# Patient Record
Sex: Male | Born: 1935
Health system: Southern US, Community
[De-identification: ages and names within clinical notes are randomized; demographics above are authoritative.]

## PROBLEM LIST (undated history)

## (undated) DIAGNOSIS — J449 Chronic obstructive pulmonary disease, unspecified: Secondary | ICD-10-CM

## (undated) DIAGNOSIS — Z860101 Personal history of adenomatous and serrated colon polyps: Secondary | ICD-10-CM

## (undated) DIAGNOSIS — N210 Calculus in bladder: Secondary | ICD-10-CM

## (undated) DIAGNOSIS — R0602 Shortness of breath: Secondary | ICD-10-CM

## (undated) DIAGNOSIS — Z86010 Personal history of colonic polyps: Secondary | ICD-10-CM

## (undated) DIAGNOSIS — K9049 Malabsorption due to intolerance, not elsewhere classified: Secondary | ICD-10-CM

## (undated) DIAGNOSIS — E785 Hyperlipidemia, unspecified: Secondary | ICD-10-CM

## (undated) DIAGNOSIS — N400 Enlarged prostate without lower urinary tract symptoms: Secondary | ICD-10-CM

## (undated) DIAGNOSIS — K08109 Complete loss of teeth, unspecified cause, unspecified class: Secondary | ICD-10-CM

## (undated) DIAGNOSIS — R3129 Other microscopic hematuria: Secondary | ICD-10-CM

## (undated) DIAGNOSIS — K4090 Unilateral inguinal hernia, without obstruction or gangrene, not specified as recurrent: Secondary | ICD-10-CM

## (undated) DIAGNOSIS — J4489 Other specified chronic obstructive pulmonary disease: Secondary | ICD-10-CM

## (undated) DIAGNOSIS — K589 Irritable bowel syndrome without diarrhea: Secondary | ICD-10-CM

## (undated) DIAGNOSIS — M1990 Unspecified osteoarthritis, unspecified site: Secondary | ICD-10-CM

## (undated) DIAGNOSIS — Z8673 Personal history of transient ischemic attack (TIA), and cerebral infarction without residual deficits: Secondary | ICD-10-CM

## (undated) DIAGNOSIS — Z973 Presence of spectacles and contact lenses: Secondary | ICD-10-CM

## (undated) DIAGNOSIS — I10 Essential (primary) hypertension: Secondary | ICD-10-CM

## (undated) DIAGNOSIS — Z972 Presence of dental prosthetic device (complete) (partial): Secondary | ICD-10-CM

## (undated) DIAGNOSIS — R3915 Urgency of urination: Secondary | ICD-10-CM

## (undated) DIAGNOSIS — Z974 Presence of external hearing-aid: Secondary | ICD-10-CM

## (undated) DIAGNOSIS — K5730 Diverticulosis of large intestine without perforation or abscess without bleeding: Secondary | ICD-10-CM

## (undated) DIAGNOSIS — D494 Neoplasm of unspecified behavior of bladder: Secondary | ICD-10-CM

## (undated) HISTORY — PX: TONSILLECTOMY: SUR1361

---

## 1959-01-31 HISTORY — PX: INGUINAL HERNIA REPAIR: SUR1180

## 1998-01-30 HISTORY — PX: CARDIAC CATHETERIZATION: SHX172

## 1999-01-28 ENCOUNTER — Inpatient Hospital Stay (HOSPITAL_COMMUNITY): Admission: EM | Admit: 1999-01-28 | Discharge: 1999-01-29 | Payer: Self-pay | Admitting: Emergency Medicine

## 1999-01-28 ENCOUNTER — Encounter: Payer: Self-pay | Admitting: Rheumatology

## 1999-02-17 ENCOUNTER — Ambulatory Visit (HOSPITAL_COMMUNITY): Admission: RE | Admit: 1999-02-17 | Discharge: 1999-02-17 | Payer: Self-pay | Admitting: Cardiology

## 2000-08-30 ENCOUNTER — Encounter (INDEPENDENT_AMBULATORY_CARE_PROVIDER_SITE_OTHER): Payer: Self-pay | Admitting: *Deleted

## 2000-08-30 ENCOUNTER — Ambulatory Visit (HOSPITAL_COMMUNITY): Admission: RE | Admit: 2000-08-30 | Discharge: 2000-08-30 | Payer: Self-pay | Admitting: Gastroenterology

## 2002-02-05 ENCOUNTER — Inpatient Hospital Stay (HOSPITAL_COMMUNITY): Admission: AD | Admit: 2002-02-05 | Discharge: 2002-02-06 | Payer: Self-pay | Admitting: Geriatric Medicine

## 2002-02-06 ENCOUNTER — Encounter: Payer: Self-pay | Admitting: Interventional Cardiology

## 2002-02-06 HISTORY — PX: CARDIOVASCULAR STRESS TEST: SHX262

## 2003-12-08 ENCOUNTER — Encounter: Admission: RE | Admit: 2003-12-08 | Discharge: 2003-12-08 | Payer: Self-pay | Admitting: Rheumatology

## 2006-01-03 ENCOUNTER — Ambulatory Visit (HOSPITAL_BASED_OUTPATIENT_CLINIC_OR_DEPARTMENT_OTHER): Admission: RE | Admit: 2006-01-03 | Discharge: 2006-01-03 | Payer: Self-pay | Admitting: Urology

## 2006-01-03 HISTORY — PX: HYDROCELE EXCISION: SHX482

## 2006-02-21 ENCOUNTER — Ambulatory Visit: Payer: Self-pay | Admitting: Critical Care Medicine

## 2006-03-26 ENCOUNTER — Ambulatory Visit: Payer: Self-pay | Admitting: Critical Care Medicine

## 2006-04-17 ENCOUNTER — Ambulatory Visit: Payer: Self-pay | Admitting: Critical Care Medicine

## 2006-07-05 ENCOUNTER — Ambulatory Visit: Payer: Self-pay | Admitting: *Deleted

## 2006-12-18 ENCOUNTER — Ambulatory Visit: Payer: Self-pay | Admitting: Critical Care Medicine

## 2006-12-18 DIAGNOSIS — I10 Essential (primary) hypertension: Secondary | ICD-10-CM

## 2006-12-18 DIAGNOSIS — E785 Hyperlipidemia, unspecified: Secondary | ICD-10-CM | POA: Insufficient documentation

## 2006-12-18 DIAGNOSIS — J449 Chronic obstructive pulmonary disease, unspecified: Secondary | ICD-10-CM

## 2006-12-18 DIAGNOSIS — J4489 Other specified chronic obstructive pulmonary disease: Secondary | ICD-10-CM | POA: Insufficient documentation

## 2007-02-07 ENCOUNTER — Encounter: Payer: Self-pay | Admitting: Critical Care Medicine

## 2007-08-08 ENCOUNTER — Encounter: Payer: Self-pay | Admitting: Critical Care Medicine

## 2007-09-25 ENCOUNTER — Ambulatory Visit: Payer: Self-pay | Admitting: Critical Care Medicine

## 2007-09-27 ENCOUNTER — Encounter: Payer: Self-pay | Admitting: Critical Care Medicine

## 2007-09-27 DIAGNOSIS — F17200 Nicotine dependence, unspecified, uncomplicated: Secondary | ICD-10-CM | POA: Insufficient documentation

## 2008-02-14 ENCOUNTER — Encounter: Admission: RE | Admit: 2008-02-14 | Discharge: 2008-02-14 | Payer: Self-pay | Admitting: Rheumatology

## 2008-06-30 ENCOUNTER — Ambulatory Visit: Payer: Self-pay | Admitting: Critical Care Medicine

## 2008-07-06 ENCOUNTER — Encounter: Payer: Self-pay | Admitting: Critical Care Medicine

## 2009-01-07 ENCOUNTER — Telehealth: Payer: Self-pay | Admitting: Critical Care Medicine

## 2009-01-18 ENCOUNTER — Ambulatory Visit: Payer: Self-pay | Admitting: Critical Care Medicine

## 2009-02-11 ENCOUNTER — Ambulatory Visit: Payer: Self-pay | Admitting: Orthopedic Surgery

## 2010-06-14 NOTE — Assessment & Plan Note (Signed)
[facility]                             PULMONARY OFFICE NOTE   NAME:Francom, Keith Giles                       MRN:          [id number]  DATE:12/18/2006                            DOB:          1933-04-17    Keith Giles is a 77 year old white male with history of chronic  obstructive lung disease, asthmatic bronchitic component.  He comes to  the office today still coughing, still dyspneic.  He could not tolerate  Advair or Asmanex because of throat irritation.  He maintains aspirin 81  mg daily, Lipitor 20 mg daily, and hydrochlorothiazide 12.5 mg daily.  He is still smoking a pack a  day of cigarettes.   PHYSICAL EXAMINATION:  VITAL SIGNS:  Temperature 98, blood pressure  108/72, pulse 68, saturation 97% on room air.  CHEST:  Showed distant breath sounds with prolonged expiratory phase.  No wheeze or rhonchi noted.  CARDIAC:  Exam showed a regular rate and rhythm without S3, normal S1  and S2.  ABDOMEN:  Soft, nontender.  EXTREMITIES:  Show no edema or clubbing.  SKIN:  Clear.  NEUROLOGIC:  Exam intact.  HEENT/NECK:  Exam showed no jugular venous distention, no  lymphadenopathy.  Oropharynx clear.  Neck supple.   IMPRESSION:  Asthmatic bronchitis, chronic obstructive lung disease with  bronchitic components.   PLAN:  1. Begin Spiriva 1 capsule daily.  2. He is to utilize Nicoderm patch 7 mg daily for smoking cessation.  3. Will see the patient back in return followup.     Charlcie Cradle Delford Field, MD, [facility]  Electronically Signed    PEW/MedQ  DD: 12/18/2006  DT: 12/18/2006  Job #: [id number]

## 2010-06-17 NOTE — Consult Note (Signed)
NAME:  Keith Giles, Keith Giles NO.:  [id number]   MEDICAL RECORD NO.:  [id number]                   PATIENT TYPE:  INP   LOCATION:  4705                                 FACILITY:  [facility]   PHYSICIAN:  Lesleigh Noe, M.D.            DATE OF BIRTH:  03-11-1933   DATE OF CONSULTATION:  02/05/2002  DATE OF DISCHARGE:                                   CONSULTATION   INDICATION FOR CONSULTATION:  Chest discomfort.   CONCLUSIONS:  1. Intermittent precordial chest tightness x 7-10 days, uncertain etiology.     Rule out coronary artery disease.  2. Recent upper respiratory infection with cough and yellow phlegm over the     past 7-10 days, now improved on antibiotic therapy.  3. Hyperlipidemia.  4. Chronic obstructive pulmonary disease secondary to tobacco use.  5. Abnormal electrocardiogram with incomplete right bundle and there appears     to be chronic ST elevation in V2.   RECOMMENDATIONS:  1. Serial enzymes including CK-MB and troponins.  2. Serial electrocardiograms.  3. Sublingual nitroglycerin if recurrent chest pain.  4. Subcu Lovenox.  5. Adenosine Cardiolite in a.m. 02/06/2002, if patient rules out.  Coronary     angiography if the patient rules in.   COMMENTS:  The patient is a 77 year old, chronic smoker, who has had an  upper respiratory infection for the past 10-14 days.  He has also had  intermittent shoulder to shoulder chest tightness.  There is no associated  shortness of breath, diaphoresis or radiation to the arms.  The cough and  upper respiratory congestion have improved on antibiotics and phlegm has  gone from yellow to white.  He still occasionally has the chest tightness.  He has also been having a more pleuritic type right posterior mid chest  discomfort when he takes a deep breath.  No chills or fever.  This chest  discomfort that he has had is different than the episodes of discomfort that  he has had in the past that has led  to cardiac evaluation.  In January 2001,  he underwent coronary angiography which revealed luminal irregularities in  the LAD, circumflex, and RCA, but no high-grade obstruction.  He had an  indeterminate adenosine Cardiolite in 1996.   He smokes around one pack of cigarettes per day.   PAST MEDICAL HISTORY:  1. Hyperlipidemia.  2. Transient ischemic attack.  3. Cigarette smoking with chronic obstructive pulmonary disease .   FAMILY HISTORY:  Father died of myocardial infarction at age 25.   REVIEW OF SYMPTOMS:  Denies palpitations.  No orthopnea.  Has not had  syncope.  No history of hypertension.   PHYSICAL EXAMINATION:  GENERAL:  The patient is alert and oriented x 3.  VITAL SIGNS:  Blood pressure 118/70, heart rate 70.  SKIN:  Clear, no cyanosis.  NECK:  Neck veins are not distended, no carotid bruits.  LUNGS:  Clear to auscultation and percussion.  CARDIAC:  No click, no gallop.  A soft 1/6 systolic murmur is heard.  ABDOMEN:  Soft, liver and spleen not palpable, bowel sounds are normal.  EXTREMITIES:  Pulses are 2+ and symmetric in the upper and lower  extremities.   LABORATORY DATA:  EKG reveals an incomplete right bundle branch block with  ST elevation in V1 and V2.  In a comparison with prior EKGs this ST  elevation is not significantly different.  Laboratory data is pending.                                               Lesleigh Noe, M.D.    HWS/MEDQ  D:  02/05/2002  T:  02/05/2002  Job:  [id number]   cc:   Demetria Pore. Coral Spikes, M.D.  [address]  [address]  [place]  [place] [address]  Fax: [phone]   Francisca December, M.D.  [address]  [address]  [place]  [place] [address]  Fax: [phone]

## 2010-06-17 NOTE — Assessment & Plan Note (Signed)
[facility]                             PULMONARY OFFICE NOTE   NAME:Blick, ANHAD SHEELEY                       MRN:          [id number]  DATE:02/21/2006                            DOB:          [birth year]    CHIEF COMPLAINT:  Cough and shortness of breath.   HISTORY OF PRESENT ILLNESS:  This 77 year old white male has had cough  and dyspnea for two years.  The cough is productive of thick, clear  mucus.  He has had increased sneezing after he coughs.  The cough is  worse in the morning.  Also occasionally some during the day.  He has  had observed apneas when he has been asleep.  Also, wheezes when he is  asleep.  He has not yet had a formal sleep study.  He works part-time in  a golf course setting but does note dyspnea when walking up an incline.  He smoked less than a pack a day and has done so for 60 years.  He  continues to smoke currently.  He has some occasional heartburn,  indigestion.  No real chest pain.  Just feels swollen and tight.  No  hemoptysis.  No irregular heartbeats.  Denies any loss of appetite,  weight change, abdominal pain, difficulty swallowing, or sore throat.  Has noted some nasal congestion, some itching.  There has been some  joint stiffness and pain.  He is referred for further evaluation.   PAST MEDICAL HISTORY:  History of hypertension.  No history of angina,  heart failure, or heart dysrhythmias.  Does have a history of emphysema,  increased cholesterol, allergies.  No history of renal disease or sleep  apnea.  No disorders of the central nervous system.   OPERATIVE HISTORY:  Two hernia repairs, in 1961 and in 1964.  A  hydrocele repair in December, 2007.   He has adverse reactions to ANTIHISTAMINE.   CURRENT MEDICATIONS:  1. Aspirin 81 mg daily.  2. Lipitor 20 mg daily.  3. HCTZ 12.5 mg daily.   SOCIAL HISTORY:  Smoked for six years, less than a pack a day.  He lives  with a spouse, retired.   FAMILY HISTORY:   Positive for asthma in a mother.  Allergies in the  patient.  Heart disease in his father.   REVIEW OF SYSTEMS:  Noncontributory.   PHYSICAL EXAMINATION:  GENERAL:  This is an elderly male in no distress.  VITAL SIGNS:  Temp 97, blood pressure 110/74, pulse 60.  Saturation 98%  on room air.  CHEST:  Distant breath sounds with prolonged expiratory phase.  No  wheeze, rales, or rhonchi were noted.  CARDIAC:  Regular rate and rhythm without S3.  Normal S1 and S2.  ABDOMEN:  Soft, nontender.  EXTREMITIES:  No edema, clubbing, or venous disease.  SKIN:  Clear.  NEUROLOGIC:  Intact.   Chest x-ray obtained showed COPD changes with peribronchial thickening.   Spirometry obtained showed an FEV1 of 2.74, FVC 4.38, FEV1/FVC ratio of  62% predicted, compatible with mild-to-moderate obstructive defect.  Other lab data courtesy of Dr. Coral Spikes  have been sent and are reviewed  from December, 2007, a CMET and CBC are unremarkable.   IMPRESSION:  Asthmatic bronchitis with chronic obstructive lung disease,  possibly early emphysema as well with ongoing smoking use and reflux  disease.   RECOMMENDATIONS:  Patient is to initiate Protonix at 40 mg daily, to be  taken a half hour before eating at breakfast, and then the patient is to  consume a meal.  Patient is to begin Advair 250/50 1 spray b.i.d. and  pulse prednisone at 40 mg a day, tapering down by 10 mg every 2 days  until off.  Patient will also initiate Nicotrol inhaler and was given  instructions as to its proper usage.  We will see the patient back in  followup in a month's time.     Charlcie Cradle Delford Field, MD, [facility]  Electronically Signed    PEW/MedQ  DD: 02/22/2006  DT: 02/22/2006  Job #: [id number]   cc:   Demetria Pore. Coral Spikes, M.D.
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
## 2010-06-17 NOTE — Consult Note (Signed)
[facility]  Patient:    Keith Giles                          MRN: [id number] Proc. Date: 01/28/99 Adm. Date:  [id number] Attending:  Genene Churn CC:         Keith Giles, M.D., Medicine                          Consultation Report  REASON FOR CONSULTATION:   Chest pain.  HISTORY OF PRESENT ILLNESS:  Keith Giles is a 77 year old man who two days ago developed a spontaneous onset of right upper chest discomfort, which would radiate through to the shoulder and scapula.   It was associated with some nausea, but o diaphoresis or shortness of breath.   Notably, if he had something to eat, the nausea would resolve and the chest pain would become decreased after 20-30 minutes. He had several episodes of this, each becoming more severe in nature.  There is no exertional component to it.  He had the abrupt onset while driving today and decided to present to the hospital with the discomfort.  He felt that he had bronchitis, as occurred in 1997, and he had similar pains in his chest.   He was given antibiotics at the time and the discomfort resolved.   He denies any cough or sputum production at this time.  In 1996, he underwent a myocardial profusion study with thallium, which showed  possible reversible inferior defect versus diaphragmatic attenuation.  Because f the atypical presentation and the nondiagnostic nature of the study, a cardiac catheterization was not pursued.   He had several exercise treadmill tests prior to that, which were nondiagnostic as well.  PAST MEDICAL HISTORY: 1. ______ polyps. 2. History of chest wall pain. 3. Mechanical low back pain. 4. History of prostatitis.  PAST SURGICAL HISTORY: 1. Status post prostate biopsy. 2. Left inguinal hernia. 3. Tonsillectomy.  SOCIAL HISTORY:  He is currently married to his second wife and she has developed kidney failure now on peritoneal dialysis.   He spends a  fair amount of time at  home caring for her.  He smokes a pack of cigarettes per day; rarely drinks any  alcohol.   He retired from [employer] in [place].  CURRENT MEDICATIONS:  None regularly.  FAMILY HISTORY:  Significant for heart disease before the age of 39, anemia, hay fever, leukemia, stroke, and colon cancer in a cousin.  REVIEW OF SYSTEMS:  Unremarkable, except as noted above.  PHYSICAL EXAMINATION:  VITAL SIGNS:  Blood pressure 130/75, pulse 75, respiratory rate 16, temperature  afebrile.  In general, this is a well appearing 77 year old man in no acute distress, lying supine on the gurney.  HEENT:  Unremarkable. PERRLA, anicteric sclerae, oral mucosa is pink and moist.   Neck:  Supple without thyromegaly or masses.   Carotid upstrokes are normal. No bruits.   No jugular venous distention.  Chest:  Diminished breath sounds bilaterally with no wheezes, rales, or rhonchi. There is somewhat decreased excursion bilaterally.  There is some chest wall tenderness in the left upper chest that radiates through to the scapula.  There is some reproduction of the pain with movement of the left shoulder.  Heart:  Regular rhythm, normal S1, S2 is heard, no S3, S4, murmur, click, or rub noted.  The PMI is not palpable.  Abdomen:  Soft, flat, nontender, no hepatosplenomegaly or midline pulse or masses, bowel sounds present in all quadrants.  Genitalia/Rectum:  Deferred.  Extremities:  Show full range of motion, no edema, intact distal pulses. Pulses are intact over the femoral without bruit as well.  Neurologic:  He is alert and oriented x 3. Cranial nerves II-XII are intact. Motor and sensory not tested.  Skin:  Warm, dry, and clear.  ACCESSORY CLINICAL DATA:  Admission hemogram:  PT, PTT, serum electrolytes, BUN, creatinine, glucose, calcium, liver associated enzymes, all within normal limits. Amylase is 55, lipase is 24.  Urinalysis  unremarkable.  Electrocardiogram reveals incomplete right bundle branch block, slight ST elevation of V2, tracing unchanged from that performed in 1996.  A chest x-ray shows chronic obstructive pulmonary disease and no acute disease.  Total CK initially is 93 with an MB of 1.8.  Troponin is less than 0.3.  IMPRESSION:   This is a 77 year old man with multiple risk factors for coronary  disease, one of many presentations with atypical chest pain.  While the pain is  somewhat reproducible by palpation and movement, this is not completely reassuring. I am concerned that we are missing the diagnosis of coronary artery disease and in speaking with him, Keith Giles tends to at this time minimize his symptoms; however, it was severe enough for him to present himself to the office this afternoon after not being seen for four years and asked to be evaluated.  PLAN/RECOMMENDATION: 1. Agree with your desire to admit for rule out myocardial infarction, repeat CK/MB, troponin enzyme assay x 2.  2. Agree with use of intravenous nitroglycerin and heparin, though the former has produced headache and may be discontinued if worsens.  Would also give aspirin s you written for.  3. To finally settle this question, I would recommend cardiac catheterization with coronary angiography.  I would do this within the next 24 hours; however, this s Friday before a three day weekend and he has a chronically ill wife at home to are for.  If he rules out for myocardial infarction, appropriate medication should e initiated, and we can schedule his cardiac catheterization for later in the month, provided no further chest pain occurs.   Finally, would add a non-steroidal anti-inflammatory drug to his regimen. DD:  01/28/99 TD:  01/29/99 Job: [id number]

## 2010-06-17 NOTE — Assessment & Plan Note (Signed)
[facility]                             PULMONARY OFFICE NOTE   NAME:Creedon, SIGISMUND CROSS                       MRN:          [id number]  DATE:04/17/2006                            DOB:          [birth year]    Mr. Keith Giles is a 77 year old white male with history of chronic  obstructive lung disease, asthmatic bronchitic component, here for  follow-up.  He is still smoking six cigarettes daily, down from a pack a  day.  He cannot tolerate the Advair.  He takes it twice a day.  He has  severe headaches.  He is taking it once a day at 250/50 strength.  He is  having improvement in chest congestion and cough with the Advair.   PHYSICAL EXAMINATION:  VITAL SIGNS:  Temperature 97, blood pressure  124/76, pulse 57, saturation 97% on room air.  CHEST:  Diminished breath sounds with prolonged expiratory phase, no  wheeze or rhonchi were noted.  CARDIOVASCULAR:  Regular rate and rhythm without S3, normal S1 and S2.  ABDOMEN:  Soft, nontender.  EXTREMITIES:  No edema or clubbing.  SKIN:  Clear.   IMPRESSION:  Chronic obstructive lung disease, asthmatic bronchitic  component but intolerance to Advair therapy.   PLAN:  Begin Asmanex one spray daily.  He was instructed as to its  proper use.  He will take it nightly and discontinue further Advair.  He  is to continue to pursue smoking cessation.  He does have the  prescription for Nicotrol inhaler.  He was encouraged to fill this if he  cannot stop smoking completely on his own.  Will see the patient back in  follow-up in three months.     Charlcie Cradle Delford Field, MD, [facility]  Electronically Signed    PEW/MedQ  DD: 04/17/2006  DT: 04/18/2006  Job #: [id number]   cc:   Demetria Pore. Coral Spikes, M.D.

## 2010-06-17 NOTE — Procedures (Signed)
[facility]  Patient:    Keith Giles, Keith Giles                         MRN: [id number] Proc. Date: 08/30/00 Attending:  Verlin Grills, M.D.                           Procedure Report  DATE OF BIRTH:  03/01/1933.  REFERRING PHYSICIAN:  Demetria Pore. Coral Spikes, M.D.  PROCEDURE PERFORMED:  Colonoscopy.  ENDOSCOPIST:  Verlin Grills, M.D.  INDICATIONS FOR PROCEDURE:  The patient is a 77 year old male.  [name] had an episode of painless hematochezia which has resolved.  PREMEDICATION:  Fentanyl 50 mcg, Versed 6 mg.  ENDOSCOPE:  Olympus pediatric video colonoscope.  DESCRIPTION OF PROCEDURE:  After obtaining informed consent, the patient was placed in the left lateral decubitus position.  I administered intravenous fentanyl and intravenous Versed to achieve conscious sedation for the procedure.  The patients blood pressure, oxygen saturation and cardiac rhythm were monitored throughout the procedure and documented in the medical record.  Anal inspection was normal.  Digital rectal examination revealed an enlarged, non-nodular prostate.  The Olympus pediatric video colonoscope was then introduced into the rectum and advanced to the cecum.  Colonic preparation for the exam today was satisfactory.  Rectum:  Normal.  Sigmoid colon and descending colon:  Extensive left colonic diverticulosis.  Splenic flexure:  Normal.  Transverse colon:  Normal.  Hepatic flexure:  Normal.  Ascending colon:  From the midascending colon two 1 mm sessile polyps were removed with cold biopsy forceps and submitted for pathologic interpretation.  Cecum and ileocecal valve:  Normal.  ASSESSMENT: 1. Extensive left colonic diverticulosis. 2. From the ascending colon, two 1 mm sessile polyps were removed with cold    biopsy forceps. RECOMMENDATIONS:  If the ascending colon polyps return neoplastic pathologically, [name] should undergo a repeat  colonoscopy in approximately five years.  If the ascending colon polyps are non-neoplastic, [name] should undergo a repeat colonoscopy in approximately 10 years. DD:  08/30/00 TD:  08/30/00 Job: [id number]

## 2010-06-17 NOTE — Discharge Summary (Signed)
NAME:  Keith Giles, Keith Giles NO.:  [id number]   MEDICAL RECORD NO.:  [id number]                   PATIENT TYPE:  INP   LOCATION:  4705                                 FACILITY:  [facility]   PHYSICIAN:  Hal T. Stoneking, M.D.              DATE OF BIRTH:  Sep 16, 1933   DATE OF ADMISSION:  02/05/2002  DATE OF DISCHARGE:  02/06/2002                                 DISCHARGE SUMMARY   ADMISSION DIAGNOSIS:  Chest pain, rule out myocardial infarction.   DISCHARGE DIAGNOSES:  1. Noncardiac chest pain.  2. History of hyperlipidemia.  3. Chronic obstructive pulmonary disease.  4. Incomplete right bundle branch block on EKG.  5. History of transient ischemic attack.  6. Past history of cigarette smoking.   CONSULTANT:  Lyn Records, M.D., cardiology.   BRIEF HISTORY:  [name] is a 77 year old white male patient of Dr. Lennox Pippins.  He had had a recent upper respiratory infection for approximately  two weeks.  He had also had intermittent shoulder and chest tightness.  He  denied any shortness of breath or diaphoresis.  His cough seemed to have  improved somewhat on antibiotics, however, he is still having occasional  chest tightness although some of that appears to be somewhat pleuritic.  In  2/01, he had a cardiac catheterization which revealed luminal irregularities  in the LAD, circumflex and the right coronary artery, no high-grade  obstructions.   PHYSICAL EXAMINATION:  VITAL SIGNS:  Blood pressure 120/70, pulse rate 76.  SKIN:  Normal.  HEENT:  Unremarkable.  LUNGS:  Clear.  HEART:  Regular rate and rhythm, without murmurs, rubs or gallops.  ABDOMEN:  Soft, no hepatosplenomegaly or masses palpated.   LABORATORY DATA ON ADMISSION:  EKG showed the incomplete right bundle branch  block.  White count 10,900, hemoglobin 15.2, platelet count 226,000; 64%  neutrophils, 23% lymphs, 8% monos.  Pro time 11.2, PTT 31.  Sodium 138,  potassium 4.5, chloride  102, bicarb 28, glucose 125, BUN 13, creatinine 1.0.  Calcium 9.4, albumin 3.8, total protein 6.9, AST 22, ALT 27, ALP 82, total  bili 1.0.   HOSPITAL COURSE:  The patient had serial CK-MBs which were negative,  troponin 0.01.  He was seen in consultation by cardiology on the second  hospital admission day, he had a Cardiolite study which was negative for  ischemia, ejection fraction was 69%, therefore, this was felt to be  noncardiac chest pain.  The patient was then discharged home on aspirin 81  mg a day and his Pravachol 40 mg a day.  He is to follow up with Dr. Coral Spikes  in two to three weeks.  Hal T. Pete Glatter, M.D.    HTS/MEDQ  D:  03/05/2002  T:  03/05/2002  Job:  [id number]   cc:   Demetria Pore. Coral Spikes, M.D.  [address]  [address]  [place]  [place] [address]  Fax: [phone]

## 2010-06-17 NOTE — Op Note (Signed)
NAME:  Keith Giles, Keith Giles NO.:  [id number]   MEDICAL RECORD NO.:  [id number]          PATIENT TYPE:  AMB   LOCATION:  [facility]                         FACILITY:  [facility]   PHYSICIAN:  Valetta Fuller, M.D.  DATE OF BIRTH:  [birth year]   DATE OF PROCEDURE:  01/03/2006  DATE OF DISCHARGE:                               OPERATIVE REPORT   PREOPERATIVE DIAGNOSIS:  Left hydrocele.   POSTOPERATIVE DIAGNOSIS:  Left hydrocele.   PROCEDURE PERFORMED:  Left hydrocele repair.   SURGEON:  Valetta Fuller, M.D.   ANESTHESIA:  General.   INDICATIONS:  [name] is a 77 year old male.  He was diagnosed with  a large left hydrocele which was confirmed on clinical exam as well as  scrotal ultrasound.  Initially, it was not terribly bothersome to him  and we suggested continued observation.  He came in recently with  complaints that this had increased in size and become more bothersome to  him.  We talked about the pros and cons of surgical correction and he  elected to proceed with this.  He appeared to understand the advantages  and disadvantages of an approach.  Full informed consent was obtained.   TECHNIQUE AND FINDINGS:  The patient was brought to the operating room  where he had successful induction of general anesthesia.  He was prepped  and draped in the usual manner.  Again, clinical exam revealed a  moderate to significantly enlarged left hemiscrotum.  A median raphe  incision was made in the scrotum.  The underlying tunica vaginalis was  then identified.  It was quite thin walled.  This was opened and 400 mL  of clear yellow fluid was obtained.  The testis was then brought out the  incision and carefully inspected.  No other pathology was appreciated.  We reefed up the redundant sac in a Lord type of manner and also  anchored it behind the testis.  Once the hydrocele had been addressed,  we went ahead and returned the testicle to the left hemiscrotum taking  great care to  make sure the spermatic cord was in no way twisted.  A  Marcaine spermatic cord block was also performed.  4-0 Vicryl suture was  used for the Lord type of plication.  The dartos layer was closed with a  running 3-0 Vicryl suture and the scrotal  skin was closed with a running 4-0 was Vicryl suture.  A Tegaderm  dressing was applied.  The patient appeared to tolerate the procedure  well.  Blood loss was minimal and there were no obvious complications.  He is brought to the recovery room in stable condition.           ______________________________  Valetta Fuller, M.D.  Electronically Signed     DSG/MEDQ  D:  01/03/2006  T:  01/03/2006  Job:  [id number]   cc:   Demetria Pore. Coral Spikes, M.D.  Fax: [phone]

## 2010-06-17 NOTE — Assessment & Plan Note (Signed)
[facility]                             PULMONARY OFFICE NOTE   NAME:Bradly, KARTER HELLMER                       MRN:          [id number]  DATE:03/26/2006                            DOB:          04/09/1933    Mr. Netterville is a 77 year old white male with a history of chronic  obstructive lung disease with an asthmatic bronchitic component. He had  to stop his Advair because of irritation in the throat with a yeast  infection. He was given pulsed prednisone by one of my partners and has  now finished this. He is maintaining his maintenance medicines of  aspirin 81 mg daily, Lipitor 20 mg daily, HCTZ 12.5 mg daily.   PHYSICAL EXAMINATION:  VITAL SIGNS:  Temperature 97, blood pressure  198/60, pulse 65, saturation 97% on room air.  CHEST:  Showed distant breath sounds with no evidence of wheeze or  rhonchi.  CARDIAC:  Regular rate and rhythm without S3, normal S1, S2.  ABDOMEN:  Soft, nontender.  EXTREMITIES:  Showed no edema or clubbing.  HEENT:  No evidence of active yeast irritation in the throat. Nares were  clear, oropharynx was clear.   IMPRESSION:  Asthmatic bronchitis with chronic bronchitic component.   PLAN:  Plan is for the patient to continue to pursue smoking cessation  and to resume Advair at 250/50 one spray b.i.d. He was given refills on  this and reinstructed as to proper oral hygiene. He will return in 2  weeks for a recheck. If he has recurrent yeast in the upper airway, we  will have to look at another product.     Charlcie Cradle Delford Field, MD, [facility]  Electronically Signed    PEW/MedQ  DD: 03/26/2006  DT: 03/26/2006  Job #: [id number]

## 2010-08-05 ENCOUNTER — Ambulatory Visit: Payer: Self-pay | Admitting: Family Medicine

## 2011-02-08 DIAGNOSIS — M1610 Unilateral primary osteoarthritis, unspecified hip: Secondary | ICD-10-CM | POA: Diagnosis not present

## 2011-02-08 DIAGNOSIS — M25559 Pain in unspecified hip: Secondary | ICD-10-CM | POA: Diagnosis not present

## 2011-04-18 DIAGNOSIS — H2510 Age-related nuclear cataract, unspecified eye: Secondary | ICD-10-CM | POA: Diagnosis not present

## 2011-04-18 DIAGNOSIS — H1045 Other chronic allergic conjunctivitis: Secondary | ICD-10-CM | POA: Diagnosis not present

## 2011-06-09 DIAGNOSIS — N401 Enlarged prostate with lower urinary tract symptoms: Secondary | ICD-10-CM | POA: Diagnosis not present

## 2011-06-09 DIAGNOSIS — N529 Male erectile dysfunction, unspecified: Secondary | ICD-10-CM | POA: Diagnosis not present

## 2011-06-12 DIAGNOSIS — M75120 Complete rotator cuff tear or rupture of unspecified shoulder, not specified as traumatic: Secondary | ICD-10-CM | POA: Diagnosis not present

## 2011-06-13 DIAGNOSIS — D239 Other benign neoplasm of skin, unspecified: Secondary | ICD-10-CM | POA: Diagnosis not present

## 2011-06-13 DIAGNOSIS — L821 Other seborrheic keratosis: Secondary | ICD-10-CM | POA: Diagnosis not present

## 2011-06-13 DIAGNOSIS — D1800 Hemangioma unspecified site: Secondary | ICD-10-CM | POA: Diagnosis not present

## 2011-07-06 DIAGNOSIS — Z1331 Encounter for screening for depression: Secondary | ICD-10-CM | POA: Diagnosis not present

## 2011-07-06 DIAGNOSIS — R143 Flatulence: Secondary | ICD-10-CM | POA: Diagnosis not present

## 2011-07-06 DIAGNOSIS — I10 Essential (primary) hypertension: Secondary | ICD-10-CM | POA: Diagnosis not present

## 2011-07-06 DIAGNOSIS — M25519 Pain in unspecified shoulder: Secondary | ICD-10-CM | POA: Diagnosis not present

## 2011-08-08 DIAGNOSIS — M25519 Pain in unspecified shoulder: Secondary | ICD-10-CM | POA: Diagnosis not present

## 2012-01-03 DIAGNOSIS — Z0000 Encounter for general adult medical examination without abnormal findings: Secondary | ICD-10-CM | POA: Diagnosis not present

## 2012-01-03 DIAGNOSIS — R141 Gas pain: Secondary | ICD-10-CM | POA: Diagnosis not present

## 2012-01-03 DIAGNOSIS — R142 Eructation: Secondary | ICD-10-CM | POA: Diagnosis not present

## 2012-01-03 DIAGNOSIS — N529 Male erectile dysfunction, unspecified: Secondary | ICD-10-CM | POA: Diagnosis not present

## 2012-01-03 DIAGNOSIS — I10 Essential (primary) hypertension: Secondary | ICD-10-CM | POA: Diagnosis not present

## 2012-01-03 DIAGNOSIS — E785 Hyperlipidemia, unspecified: Secondary | ICD-10-CM | POA: Diagnosis not present

## 2012-01-03 DIAGNOSIS — Z1331 Encounter for screening for depression: Secondary | ICD-10-CM | POA: Diagnosis not present

## 2012-01-03 DIAGNOSIS — Z125 Encounter for screening for malignant neoplasm of prostate: Secondary | ICD-10-CM | POA: Diagnosis not present

## 2012-05-15 DIAGNOSIS — H2510 Age-related nuclear cataract, unspecified eye: Secondary | ICD-10-CM | POA: Diagnosis not present

## 2012-06-17 DIAGNOSIS — L821 Other seborrheic keratosis: Secondary | ICD-10-CM | POA: Diagnosis not present

## 2012-06-17 DIAGNOSIS — D235 Other benign neoplasm of skin of trunk: Secondary | ICD-10-CM | POA: Diagnosis not present

## 2012-06-17 DIAGNOSIS — L708 Other acne: Secondary | ICD-10-CM | POA: Diagnosis not present

## 2012-06-17 DIAGNOSIS — L723 Sebaceous cyst: Secondary | ICD-10-CM | POA: Diagnosis not present

## 2012-06-17 DIAGNOSIS — D1800 Hemangioma unspecified site: Secondary | ICD-10-CM | POA: Diagnosis not present

## 2012-06-17 DIAGNOSIS — D239 Other benign neoplasm of skin, unspecified: Secondary | ICD-10-CM | POA: Diagnosis not present

## 2012-06-17 DIAGNOSIS — L219 Seborrheic dermatitis, unspecified: Secondary | ICD-10-CM | POA: Diagnosis not present

## 2012-06-17 DIAGNOSIS — L659 Nonscarring hair loss, unspecified: Secondary | ICD-10-CM | POA: Diagnosis not present

## 2012-07-02 DIAGNOSIS — N529 Male erectile dysfunction, unspecified: Secondary | ICD-10-CM | POA: Diagnosis not present

## 2012-07-02 DIAGNOSIS — N200 Calculus of kidney: Secondary | ICD-10-CM | POA: Diagnosis not present

## 2012-07-02 DIAGNOSIS — N401 Enlarged prostate with lower urinary tract symptoms: Secondary | ICD-10-CM | POA: Diagnosis not present

## 2012-07-02 DIAGNOSIS — R319 Hematuria, unspecified: Secondary | ICD-10-CM | POA: Diagnosis not present

## 2012-07-04 DIAGNOSIS — I10 Essential (primary) hypertension: Secondary | ICD-10-CM | POA: Diagnosis not present

## 2012-10-02 DIAGNOSIS — I10 Essential (primary) hypertension: Secondary | ICD-10-CM | POA: Diagnosis not present

## 2012-10-02 DIAGNOSIS — R42 Dizziness and giddiness: Secondary | ICD-10-CM | POA: Diagnosis not present

## 2012-10-24 DIAGNOSIS — R141 Gas pain: Secondary | ICD-10-CM | POA: Diagnosis not present

## 2012-10-24 DIAGNOSIS — I10 Essential (primary) hypertension: Secondary | ICD-10-CM | POA: Diagnosis not present

## 2012-10-24 DIAGNOSIS — N529 Male erectile dysfunction, unspecified: Secondary | ICD-10-CM | POA: Diagnosis not present

## 2012-12-30 ENCOUNTER — Other Ambulatory Visit: Payer: Self-pay | Admitting: Internal Medicine

## 2012-12-30 ENCOUNTER — Other Ambulatory Visit: Payer: Self-pay | Admitting: Gastroenterology

## 2012-12-30 ENCOUNTER — Ambulatory Visit
Admission: RE | Admit: 2012-12-30 | Discharge: 2012-12-30 | Disposition: A | Discharge status: Home / Self Care | Payer: Medicare Other | Source: Ambulatory Visit | Attending: Internal Medicine | Admitting: Internal Medicine

## 2012-12-30 DIAGNOSIS — I10 Essential (primary) hypertension: Secondary | ICD-10-CM | POA: Diagnosis not present

## 2012-12-30 DIAGNOSIS — K921 Melena: Secondary | ICD-10-CM | POA: Diagnosis not present

## 2012-12-30 DIAGNOSIS — R141 Gas pain: Secondary | ICD-10-CM | POA: Diagnosis not present

## 2012-12-30 DIAGNOSIS — R05 Cough: Secondary | ICD-10-CM

## 2012-12-30 DIAGNOSIS — Z86010 Personal history of colonic polyps: Secondary | ICD-10-CM | POA: Diagnosis not present

## 2012-12-30 DIAGNOSIS — J449 Chronic obstructive pulmonary disease, unspecified: Secondary | ICD-10-CM | POA: Diagnosis not present

## 2012-12-30 DIAGNOSIS — R42 Dizziness and giddiness: Secondary | ICD-10-CM | POA: Diagnosis not present

## 2012-12-30 DIAGNOSIS — R197 Diarrhea, unspecified: Secondary | ICD-10-CM | POA: Diagnosis not present

## 2013-02-11 ENCOUNTER — Ambulatory Visit (HOSPITAL_COMMUNITY)
Admission: RE | Admit: 2013-02-11 | Discharge: 2013-02-11 | Disposition: A | Discharge status: Home / Self Care | Payer: Medicare Other | Source: Ambulatory Visit | Attending: Gastroenterology | Admitting: Gastroenterology

## 2013-02-11 ENCOUNTER — Encounter (HOSPITAL_COMMUNITY)
Admission: RE | Disposition: A | Discharge status: Home / Self Care | Payer: Self-pay | Source: Ambulatory Visit | Attending: Gastroenterology

## 2013-02-11 ENCOUNTER — Encounter (HOSPITAL_COMMUNITY): Payer: Self-pay | Admitting: *Deleted

## 2013-02-11 DIAGNOSIS — R197 Diarrhea, unspecified: Secondary | ICD-10-CM | POA: Insufficient documentation

## 2013-02-11 DIAGNOSIS — J449 Chronic obstructive pulmonary disease, unspecified: Secondary | ICD-10-CM | POA: Diagnosis not present

## 2013-02-11 DIAGNOSIS — K5730 Diverticulosis of large intestine without perforation or abscess without bleeding: Secondary | ICD-10-CM | POA: Diagnosis not present

## 2013-02-11 DIAGNOSIS — K921 Melena: Secondary | ICD-10-CM | POA: Diagnosis not present

## 2013-02-11 DIAGNOSIS — Z860100 Personal history of colon polyps, unspecified: Secondary | ICD-10-CM | POA: Insufficient documentation

## 2013-02-11 DIAGNOSIS — J4489 Other specified chronic obstructive pulmonary disease: Secondary | ICD-10-CM | POA: Insufficient documentation

## 2013-02-11 DIAGNOSIS — I10 Essential (primary) hypertension: Secondary | ICD-10-CM | POA: Insufficient documentation

## 2013-02-11 DIAGNOSIS — E7800 Pure hypercholesterolemia, unspecified: Secondary | ICD-10-CM | POA: Insufficient documentation

## 2013-02-11 DIAGNOSIS — K599 Functional intestinal disorder, unspecified: Secondary | ICD-10-CM | POA: Diagnosis not present

## 2013-02-11 HISTORY — DX: Other microscopic hematuria: R31.29

## 2013-02-11 HISTORY — DX: Essential (primary) hypertension: I10

## 2013-02-11 HISTORY — DX: Benign prostatic hyperplasia without lower urinary tract symptoms: N40.0

## 2013-02-11 HISTORY — PX: COLONOSCOPY: SHX5424

## 2013-02-11 HISTORY — DX: Chronic obstructive pulmonary disease, unspecified: J44.9

## 2013-02-11 HISTORY — DX: Unspecified osteoarthritis, unspecified site: M19.90

## 2013-02-11 HISTORY — DX: Shortness of breath: R06.02

## 2013-02-11 SURGERY — COLONOSCOPY
Anesthesia: Moderate Sedation

## 2013-02-11 MED ORDER — FENTANYL CITRATE 0.05 MG/ML IJ SOLN
INTRAMUSCULAR | Status: DC | PRN
  Administered 2013-02-11: 25 ug via INTRAVENOUS

## 2013-02-11 MED ORDER — MIDAZOLAM HCL 5 MG/5ML IJ SOLN
INTRAMUSCULAR | Status: DC | PRN
  Administered 2013-02-11 (×2): 2 mg via INTRAVENOUS

## 2013-02-11 MED ORDER — MIDAZOLAM HCL 10 MG/2ML IJ SOLN
INTRAMUSCULAR | Status: AC
  Filled 2013-02-11: qty 2

## 2013-02-11 MED ORDER — FENTANYL CITRATE 0.05 MG/ML IJ SOLN
INTRAMUSCULAR | Status: AC
Start: 2013-02-11 — End: 2013-02-11
  Filled 2013-02-11: qty 2

## 2013-02-11 NOTE — Discharge Instructions (Signed)
Colonoscopy °Care After °These instructions give you information on caring for yourself after your procedure. Your doctor may also give you more specific instructions. Call your doctor if you have any problems or questions after your procedure. °HOME CARE °· Take it easy for the next 24 hours. °· Rest. °· Walk or use warm packs on your belly (abdomen) if you have belly cramping or gas. °· Do not drive for 24 hours. °· You may shower. °· Do not sign important papers or use machinery for 24 hours. °· Drink enough fluids to keep your pee (urine) clear or pale yellow. °· Resume your normal diet. Avoid heavy or fried foods. °· Avoid alcohol. °· Continue taking your normal medicines. °· Only take medicine as told by your doctor. Do not take aspirin. °If you had growths (polyps) removed: °· Do not take aspirin. °· Do not drink alcohol for 7 days or as told by your doctor. °· Eat a soft diet for 24 hours. °GET HELP RIGHT AWAY IF: °· You have a fever. °· You pass clumps of tissue (blood clots) or fill the toilet with blood. °· You have belly pain that gets worse and medicine does not help. °· Your belly is puffy (swollen). °· You feel sick to your stomach (nauseous) or throw up (vomit). °MAKE SURE YOU: °· Understand these instructions. °· Will watch your condition. °· Will get help right away if you are not doing well or get worse. °Document Released: 02/18/2010 Document Revised: 04/10/2011 Document Reviewed: 09/23/2012 °ExitCare® Patient Information ©2014 [employer]. ° °

## 2013-02-11 NOTE — H&P (Signed)
Problem: Chronic diarrhea  History: The patient is a 80 year old male born April 17, 1933. Patient has a personal history of adenomatous colon polyps removed colonoscopically. On 08/17/2009 he underwent a normal surveillance colonoscopy.  For over a year, the patient has experienced episodes of postprandial abdominal bloating, abdominal distention, and generalized abdominal discomfort with bowel urgency and explosive diarrhea. The patient denies nausea or vomiting. He reports no weight loss. There is no history of celiac disease, carbohydrate intolerance, or diverticulitis. Intermittently, he passes a small amount of blood with his explosive diarrheal episodes. He denies nocturnal diarrhea.  Patient is scheduled to undergo a diagnostic colonoscopy with performance of random colon biopsies to look for microscopic colitis.  Past medical history: Hypertension. Benign prostatic hypertrophy. Chronic obstructive pulmonary disease. Hypercholesterolemia. Adenomatous colon polyps removed colonoscopically. Hydrocele surgery. Kidney stone. Osteoarthritis. Left inguinal herniorrhaphy. Tonsillectomy.  Allergies: Sulfa drugs.  Exam: The patient is alert and lying comfortably on the endoscopy stretcher. Abdomen is soft nontender to palpation. Lungs are clear to auscultation. Cardiac exam reveals a regular rhythm.  Plan: Proceed with diagnostic colonoscopy to evaluate chronic diarrhea.

## 2013-02-11 NOTE — Op Note (Signed)
Problem: Chronic diarrhea with intermittent small-volume hematochezia. Personal history of adenomatous colon polyps removed colonoscopically. Normal surveillance colonoscopy on 08/17/2009  Endoscopist: Earle Gell  Premedication: Fentanyl 25 mcg. Versed 4 mg  Procedure: Diagnostic colonoscopy to the ascending colon with random colon biopsies to rule out microscopic colitis. The patient was placed in the left lateral decubitus position. Anal inspection and digital rectal exam were normal. The Pentax pediatric colonoscope was introduced into the rectum and advanced to the mid ascending colon. I was unable to intubate the cecum due to colonic loop formation and patient discomfort. Performance of the colonoscopy was technically difficult due to colonic loop formation  Rectum. Normal. Retroflex view of the distal rectum normal.  Sigmoid colon and descending colon. Extensive left colonic diverticulosis  Splenic flexure. Normal  Transverse colon. Normal  Hepatic flexure. Normal  Ascending colon. Normal  Cecum and ileocecal valve: Not examined  Biopsies: Random colon biopsies were performed from the right colon and left colon to look for microscopic colitis  Assessment: Normal diagnostic proctocolonoscopy to the mid ascending colon. The cecum was not examined. Random colon biopsies to rule out microscopic colitis pending

## 2013-02-12 ENCOUNTER — Encounter (HOSPITAL_COMMUNITY): Payer: Self-pay | Admitting: Gastroenterology

## 2013-04-14 DIAGNOSIS — R42 Dizziness and giddiness: Secondary | ICD-10-CM | POA: Diagnosis not present

## 2013-05-01 DIAGNOSIS — J40 Bronchitis, not specified as acute or chronic: Secondary | ICD-10-CM | POA: Diagnosis not present

## 2013-05-01 DIAGNOSIS — J441 Chronic obstructive pulmonary disease with (acute) exacerbation: Secondary | ICD-10-CM | POA: Diagnosis not present

## 2013-05-01 DIAGNOSIS — J309 Allergic rhinitis, unspecified: Secondary | ICD-10-CM | POA: Diagnosis not present

## 2013-06-17 DIAGNOSIS — D1800 Hemangioma unspecified site: Secondary | ICD-10-CM | POA: Diagnosis not present

## 2013-06-17 DIAGNOSIS — L219 Seborrheic dermatitis, unspecified: Secondary | ICD-10-CM | POA: Diagnosis not present

## 2013-06-17 DIAGNOSIS — D239 Other benign neoplasm of skin, unspecified: Secondary | ICD-10-CM | POA: Diagnosis not present

## 2013-06-17 DIAGNOSIS — L820 Inflamed seborrheic keratosis: Secondary | ICD-10-CM | POA: Diagnosis not present

## 2013-06-17 DIAGNOSIS — L723 Sebaceous cyst: Secondary | ICD-10-CM | POA: Diagnosis not present

## 2013-06-17 DIAGNOSIS — L819 Disorder of pigmentation, unspecified: Secondary | ICD-10-CM | POA: Diagnosis not present

## 2013-06-17 DIAGNOSIS — L821 Other seborrheic keratosis: Secondary | ICD-10-CM | POA: Diagnosis not present

## 2013-07-08 DIAGNOSIS — R05 Cough: Secondary | ICD-10-CM | POA: Diagnosis not present

## 2013-07-08 DIAGNOSIS — I10 Essential (primary) hypertension: Secondary | ICD-10-CM | POA: Diagnosis not present

## 2013-07-08 DIAGNOSIS — R059 Cough, unspecified: Secondary | ICD-10-CM | POA: Diagnosis not present

## 2013-07-08 DIAGNOSIS — Z23 Encounter for immunization: Secondary | ICD-10-CM | POA: Diagnosis not present

## 2013-07-08 DIAGNOSIS — Z1331 Encounter for screening for depression: Secondary | ICD-10-CM | POA: Diagnosis not present

## 2013-07-10 DIAGNOSIS — H2510 Age-related nuclear cataract, unspecified eye: Secondary | ICD-10-CM | POA: Diagnosis not present

## 2013-07-10 DIAGNOSIS — H1045 Other chronic allergic conjunctivitis: Secondary | ICD-10-CM | POA: Diagnosis not present

## 2013-08-07 ENCOUNTER — Other Ambulatory Visit: Payer: Self-pay | Admitting: Internal Medicine

## 2013-08-07 DIAGNOSIS — R053 Chronic cough: Secondary | ICD-10-CM

## 2013-08-07 DIAGNOSIS — R079 Chest pain, unspecified: Secondary | ICD-10-CM | POA: Diagnosis not present

## 2013-08-07 DIAGNOSIS — R05 Cough: Secondary | ICD-10-CM | POA: Diagnosis not present

## 2013-08-07 DIAGNOSIS — R059 Cough, unspecified: Secondary | ICD-10-CM | POA: Diagnosis not present

## 2013-08-11 ENCOUNTER — Ambulatory Visit
Admission: RE | Admit: 2013-08-11 | Discharge: 2013-08-11 | Disposition: A | Discharge status: Home / Self Care | Payer: Medicare Other | Source: Ambulatory Visit | Attending: Internal Medicine | Admitting: Internal Medicine

## 2013-08-11 DIAGNOSIS — J438 Other emphysema: Secondary | ICD-10-CM | POA: Diagnosis not present

## 2013-08-11 DIAGNOSIS — R05 Cough: Secondary | ICD-10-CM

## 2013-08-11 DIAGNOSIS — R053 Chronic cough: Secondary | ICD-10-CM

## 2013-08-11 MED ORDER — IOHEXOL 300 MG/ML  SOLN
75.0000 mL | Freq: Once | INTRAMUSCULAR | Status: AC | PRN
  Administered 2013-08-11: 75 mL via INTRAVENOUS

## 2013-12-26 DIAGNOSIS — J329 Chronic sinusitis, unspecified: Secondary | ICD-10-CM | POA: Diagnosis not present

## 2014-01-09 DIAGNOSIS — Z1389 Encounter for screening for other disorder: Secondary | ICD-10-CM | POA: Diagnosis not present

## 2014-01-09 DIAGNOSIS — Z0000 Encounter for general adult medical examination without abnormal findings: Secondary | ICD-10-CM | POA: Diagnosis not present

## 2014-01-09 DIAGNOSIS — I10 Essential (primary) hypertension: Secondary | ICD-10-CM | POA: Diagnosis not present

## 2014-01-09 DIAGNOSIS — J449 Chronic obstructive pulmonary disease, unspecified: Secondary | ICD-10-CM | POA: Diagnosis not present

## 2014-01-09 DIAGNOSIS — N400 Enlarged prostate without lower urinary tract symptoms: Secondary | ICD-10-CM | POA: Diagnosis not present

## 2014-01-09 DIAGNOSIS — E780 Pure hypercholesterolemia: Secondary | ICD-10-CM | POA: Diagnosis not present

## 2014-01-14 DIAGNOSIS — H6121 Impacted cerumen, right ear: Secondary | ICD-10-CM | POA: Diagnosis not present

## 2014-03-17 DIAGNOSIS — J42 Unspecified chronic bronchitis: Secondary | ICD-10-CM | POA: Diagnosis not present

## 2014-03-31 DIAGNOSIS — J42 Unspecified chronic bronchitis: Secondary | ICD-10-CM | POA: Diagnosis not present

## 2014-06-23 DIAGNOSIS — L720 Epidermal cyst: Secondary | ICD-10-CM | POA: Diagnosis not present

## 2014-06-23 DIAGNOSIS — L821 Other seborrheic keratosis: Secondary | ICD-10-CM | POA: Diagnosis not present

## 2014-06-23 DIAGNOSIS — D229 Melanocytic nevi, unspecified: Secondary | ICD-10-CM | POA: Diagnosis not present

## 2014-06-23 DIAGNOSIS — I789 Disease of capillaries, unspecified: Secondary | ICD-10-CM | POA: Diagnosis not present

## 2014-06-23 DIAGNOSIS — D485 Neoplasm of uncertain behavior of skin: Secondary | ICD-10-CM | POA: Diagnosis not present

## 2014-06-23 DIAGNOSIS — D1800 Hemangioma unspecified site: Secondary | ICD-10-CM | POA: Diagnosis not present

## 2014-06-23 DIAGNOSIS — Z1283 Encounter for screening for malignant neoplasm of skin: Secondary | ICD-10-CM | POA: Diagnosis not present

## 2014-06-23 DIAGNOSIS — L820 Inflamed seborrheic keratosis: Secondary | ICD-10-CM | POA: Diagnosis not present

## 2014-06-23 DIAGNOSIS — L814 Other melanin hyperpigmentation: Secondary | ICD-10-CM | POA: Diagnosis not present

## 2014-07-13 DIAGNOSIS — K589 Irritable bowel syndrome without diarrhea: Secondary | ICD-10-CM | POA: Diagnosis not present

## 2014-07-13 DIAGNOSIS — I10 Essential (primary) hypertension: Secondary | ICD-10-CM | POA: Diagnosis not present

## 2014-07-13 DIAGNOSIS — N401 Enlarged prostate with lower urinary tract symptoms: Secondary | ICD-10-CM | POA: Diagnosis not present

## 2014-07-14 DIAGNOSIS — H2513 Age-related nuclear cataract, bilateral: Secondary | ICD-10-CM | POA: Diagnosis not present

## 2014-07-14 DIAGNOSIS — H02834 Dermatochalasis of left upper eyelid: Secondary | ICD-10-CM | POA: Diagnosis not present

## 2014-07-14 DIAGNOSIS — H04213 Epiphora due to excess lacrimation, bilateral lacrimal glands: Secondary | ICD-10-CM | POA: Diagnosis not present

## 2014-07-14 DIAGNOSIS — H02831 Dermatochalasis of right upper eyelid: Secondary | ICD-10-CM | POA: Diagnosis not present

## 2014-08-28 DIAGNOSIS — R634 Abnormal weight loss: Secondary | ICD-10-CM | POA: Diagnosis not present

## 2014-08-28 DIAGNOSIS — R140 Abdominal distension (gaseous): Secondary | ICD-10-CM | POA: Diagnosis not present

## 2014-08-28 DIAGNOSIS — R197 Diarrhea, unspecified: Secondary | ICD-10-CM | POA: Diagnosis not present

## 2014-08-28 DIAGNOSIS — R195 Other fecal abnormalities: Secondary | ICD-10-CM | POA: Diagnosis not present

## 2014-11-14 DIAGNOSIS — H6093 Unspecified otitis externa, bilateral: Secondary | ICD-10-CM | POA: Diagnosis not present

## 2014-12-07 DIAGNOSIS — Z23 Encounter for immunization: Secondary | ICD-10-CM | POA: Diagnosis not present

## 2015-01-12 DIAGNOSIS — I700 Atherosclerosis of aorta: Secondary | ICD-10-CM | POA: Diagnosis not present

## 2015-01-12 DIAGNOSIS — Z1389 Encounter for screening for other disorder: Secondary | ICD-10-CM | POA: Diagnosis not present

## 2015-01-12 DIAGNOSIS — I10 Essential (primary) hypertension: Secondary | ICD-10-CM | POA: Diagnosis not present

## 2015-01-12 DIAGNOSIS — K589 Irritable bowel syndrome without diarrhea: Secondary | ICD-10-CM | POA: Diagnosis not present

## 2015-05-17 DIAGNOSIS — C674 Malignant neoplasm of posterior wall of bladder: Secondary | ICD-10-CM | POA: Diagnosis not present

## 2015-05-17 DIAGNOSIS — N202 Calculus of kidney with calculus of ureter: Secondary | ICD-10-CM | POA: Diagnosis not present

## 2015-05-17 DIAGNOSIS — N401 Enlarged prostate with lower urinary tract symptoms: Secondary | ICD-10-CM | POA: Diagnosis not present

## 2015-05-17 DIAGNOSIS — N210 Calculus in bladder: Secondary | ICD-10-CM | POA: Diagnosis not present

## 2015-05-17 DIAGNOSIS — Z0000 Encounter for general adult medical examination without abnormal findings: Secondary | ICD-10-CM | POA: Diagnosis not present

## 2015-05-17 DIAGNOSIS — N138 Other obstructive and reflux uropathy: Secondary | ICD-10-CM | POA: Diagnosis not present

## 2015-05-21 ENCOUNTER — Other Ambulatory Visit: Payer: Self-pay | Admitting: Urology

## 2015-05-31 ENCOUNTER — Encounter (HOSPITAL_BASED_OUTPATIENT_CLINIC_OR_DEPARTMENT_OTHER): Payer: Self-pay | Admitting: *Deleted

## 2015-05-31 NOTE — Progress Notes (Signed)
NPO AFTER MN.  ARRIVE AT 0800.  NEEDS ISTAT AND EKG. 

## 2015-06-09 ENCOUNTER — Encounter (HOSPITAL_BASED_OUTPATIENT_CLINIC_OR_DEPARTMENT_OTHER): Payer: Self-pay | Admitting: Anesthesiology

## 2015-06-09 ENCOUNTER — Encounter (HOSPITAL_BASED_OUTPATIENT_CLINIC_OR_DEPARTMENT_OTHER)
Admission: RE | Disposition: A | Discharge status: Home / Self Care | Payer: Self-pay | Source: Ambulatory Visit | Attending: Urology

## 2015-06-09 ENCOUNTER — Ambulatory Visit (HOSPITAL_BASED_OUTPATIENT_CLINIC_OR_DEPARTMENT_OTHER)
Admission: RE | Admit: 2015-06-09 | Discharge: 2015-06-09 | Disposition: A | Discharge status: Home / Self Care | Payer: Medicare Other | Source: Ambulatory Visit | Attending: Urology | Admitting: Urology

## 2015-06-09 ENCOUNTER — Ambulatory Visit (HOSPITAL_BASED_OUTPATIENT_CLINIC_OR_DEPARTMENT_OTHER): Payer: Medicare Other | Admitting: Anesthesiology

## 2015-06-09 DIAGNOSIS — Z96649 Presence of unspecified artificial hip joint: Secondary | ICD-10-CM | POA: Insufficient documentation

## 2015-06-09 DIAGNOSIS — N3289 Other specified disorders of bladder: Secondary | ICD-10-CM | POA: Diagnosis not present

## 2015-06-09 DIAGNOSIS — N138 Other obstructive and reflux uropathy: Secondary | ICD-10-CM | POA: Diagnosis not present

## 2015-06-09 DIAGNOSIS — Z7982 Long term (current) use of aspirin: Secondary | ICD-10-CM | POA: Diagnosis not present

## 2015-06-09 DIAGNOSIS — I10 Essential (primary) hypertension: Secondary | ICD-10-CM | POA: Insufficient documentation

## 2015-06-09 DIAGNOSIS — E7800 Pure hypercholesterolemia, unspecified: Secondary | ICD-10-CM | POA: Insufficient documentation

## 2015-06-09 DIAGNOSIS — N202 Calculus of kidney with calculus of ureter: Secondary | ICD-10-CM | POA: Insufficient documentation

## 2015-06-09 DIAGNOSIS — C674 Malignant neoplasm of posterior wall of bladder: Secondary | ICD-10-CM | POA: Insufficient documentation

## 2015-06-09 DIAGNOSIS — Z87891 Personal history of nicotine dependence: Secondary | ICD-10-CM | POA: Insufficient documentation

## 2015-06-09 DIAGNOSIS — N210 Calculus in bladder: Secondary | ICD-10-CM | POA: Diagnosis not present

## 2015-06-09 DIAGNOSIS — N359 Urethral stricture, unspecified: Secondary | ICD-10-CM | POA: Diagnosis not present

## 2015-06-09 DIAGNOSIS — N401 Enlarged prostate with lower urinary tract symptoms: Secondary | ICD-10-CM | POA: Insufficient documentation

## 2015-06-09 DIAGNOSIS — Z79899 Other long term (current) drug therapy: Secondary | ICD-10-CM | POA: Insufficient documentation

## 2015-06-09 DIAGNOSIS — J449 Chronic obstructive pulmonary disease, unspecified: Secondary | ICD-10-CM | POA: Diagnosis not present

## 2015-06-09 DIAGNOSIS — Q549 Hypospadias, unspecified: Secondary | ICD-10-CM | POA: Insufficient documentation

## 2015-06-09 HISTORY — PX: URETEROSCOPY: SHX842

## 2015-06-09 HISTORY — DX: Other specified chronic obstructive pulmonary disease: J44.89

## 2015-06-09 HISTORY — DX: Presence of external hearing-aid: Z97.4

## 2015-06-09 HISTORY — DX: Hyperlipidemia, unspecified: E78.5

## 2015-06-09 HISTORY — PX: CYSTOSCOPY W/ RETROGRADES: SHX1426

## 2015-06-09 HISTORY — DX: Personal history of colonic polyps: Z86.010

## 2015-06-09 HISTORY — DX: Complete loss of teeth, unspecified cause, unspecified class: K08.109

## 2015-06-09 HISTORY — DX: Calculus in bladder: N21.0

## 2015-06-09 HISTORY — DX: Presence of dental prosthetic device (complete) (partial): Z97.2

## 2015-06-09 HISTORY — DX: Personal history of adenomatous and serrated colon polyps: Z86.0101

## 2015-06-09 HISTORY — DX: Personal history of transient ischemic attack (TIA), and cerebral infarction without residual deficits: Z86.73

## 2015-06-09 HISTORY — DX: Neoplasm of unspecified behavior of bladder: D49.4

## 2015-06-09 HISTORY — DX: Diverticulosis of large intestine without perforation or abscess without bleeding: K57.30

## 2015-06-09 HISTORY — DX: Chronic obstructive pulmonary disease, unspecified: J44.9

## 2015-06-09 HISTORY — PX: CYSTOSCOPY WITH URETHRAL DILATATION: SHX5125

## 2015-06-09 HISTORY — PX: TRANSURETHRAL RESECTION OF BLADDER TUMOR: SHX2575

## 2015-06-09 HISTORY — DX: Unilateral inguinal hernia, without obstruction or gangrene, not specified as recurrent: K40.90

## 2015-06-09 HISTORY — DX: Urgency of urination: R39.15

## 2015-06-09 HISTORY — DX: Irritable bowel syndrome, unspecified: K58.9

## 2015-06-09 HISTORY — DX: Malabsorption due to intolerance, not elsewhere classified: K90.49

## 2015-06-09 HISTORY — DX: Presence of spectacles and contact lenses: Z97.3

## 2015-06-09 LAB — POCT I-STAT, CHEM 8
BUN: 17 mg/dL (ref 6–20)
CALCIUM ION: 1.22 mmol/L (ref 1.13–1.30)
CREATININE: 1 mg/dL (ref 0.61–1.24)
Chloride: 101 mmol/L (ref 101–111)
GLUCOSE: 98 mg/dL (ref 65–99)
HCT: 48 % (ref 39.0–52.0)
Hemoglobin: 16.3 g/dL (ref 13.0–17.0)
Potassium: 4 mmol/L (ref 3.5–5.1)
SODIUM: 139 mmol/L (ref 135–145)
TCO2: 23 mmol/L (ref 0–100)

## 2015-06-09 SURGERY — CYSTOSCOPY, WITH URETHRAL DILATION
Anesthesia: General | Site: Urethra

## 2015-06-09 MED ORDER — PROPOFOL 10 MG/ML IV BOLUS
INTRAVENOUS | Status: AC
  Filled 2015-06-09: qty 40

## 2015-06-09 MED ORDER — PROPOFOL 10 MG/ML IV BOLUS
INTRAVENOUS | Status: DC | PRN
  Administered 2015-06-09: 150 mg via INTRAVENOUS

## 2015-06-09 MED ORDER — CEFAZOLIN SODIUM-DEXTROSE 2-4 GM/100ML-% IV SOLN
INTRAVENOUS | Status: AC
  Filled 2015-06-09: qty 100

## 2015-06-09 MED ORDER — EPHEDRINE SULFATE 50 MG/ML IJ SOLN
INTRAMUSCULAR | Status: AC
  Filled 2015-06-09: qty 3

## 2015-06-09 MED ORDER — SODIUM CHLORIDE 0.9 % IR SOLN
Status: DC | PRN
  Administered 2015-06-09: 2000 mL

## 2015-06-09 MED ORDER — FENTANYL CITRATE (PF) 100 MCG/2ML IJ SOLN
INTRAMUSCULAR | Status: DC | PRN
  Administered 2015-06-09: 25 ug via INTRAVENOUS
  Administered 2015-06-09: 50 ug via INTRAVENOUS

## 2015-06-09 MED ORDER — ONDANSETRON HCL 4 MG/2ML IJ SOLN
INTRAMUSCULAR | Status: DC | PRN
  Administered 2015-06-09: 4 mg via INTRAVENOUS

## 2015-06-09 MED ORDER — CEFAZOLIN SODIUM-DEXTROSE 2-4 GM/100ML-% IV SOLN
2.0000 g | INTRAVENOUS | Status: DC
  Filled 2015-06-09: qty 100

## 2015-06-09 MED ORDER — DEXAMETHASONE SODIUM PHOSPHATE 4 MG/ML IJ SOLN
INTRAMUSCULAR | Status: DC | PRN
  Administered 2015-06-09: 10 mg via INTRAVENOUS

## 2015-06-09 MED ORDER — DEXAMETHASONE SODIUM PHOSPHATE 10 MG/ML IJ SOLN
INTRAMUSCULAR | Status: AC
  Filled 2015-06-09: qty 1

## 2015-06-09 MED ORDER — ONDANSETRON HCL 4 MG/2ML IJ SOLN
INTRAMUSCULAR | Status: AC
  Filled 2015-06-09: qty 2

## 2015-06-09 MED ORDER — FENTANYL CITRATE (PF) 100 MCG/2ML IJ SOLN
25.0000 ug | INTRAMUSCULAR | Status: DC | PRN
Start: 2015-06-09 — End: 2015-06-09
  Filled 2015-06-09: qty 1

## 2015-06-09 MED ORDER — SODIUM CHLORIDE 0.9 % IJ SOLN
INTRAMUSCULAR | Status: AC
  Filled 2015-06-09: qty 20

## 2015-06-09 MED ORDER — LACTATED RINGERS IV SOLN
INTRAVENOUS | Status: DC
  Administered 2015-06-09: 09:00:00 via INTRAVENOUS
  Filled 2015-06-09: qty 1000

## 2015-06-09 MED ORDER — KETOROLAC TROMETHAMINE 30 MG/ML IJ SOLN
INTRAMUSCULAR | Status: AC
  Filled 2015-06-09: qty 1

## 2015-06-09 MED ORDER — STERILE WATER FOR IRRIGATION IR SOLN
Status: DC | PRN
  Administered 2015-06-09: 3000 mL

## 2015-06-09 MED ORDER — BELLADONNA ALKALOIDS-OPIUM 16.2-60 MG RE SUPP
RECTAL | Status: AC
  Filled 2015-06-09: qty 1

## 2015-06-09 MED ORDER — DIATRIZOATE MEGLUMINE 30 % UR SOLN
URETHRAL | Status: DC | PRN
  Administered 2015-06-09: 9 mL via URETHRAL

## 2015-06-09 MED ORDER — LIDOCAINE HCL 2 % EX GEL
CUTANEOUS | Status: DC | PRN
  Administered 2015-06-09: 1 via URETHRAL

## 2015-06-09 MED ORDER — HYDROCODONE-ACETAMINOPHEN 5-325 MG PO TABS: 1.0000 | ORAL_TABLET | Freq: Four times a day (QID) | ORAL | Status: DC | PRN

## 2015-06-09 MED ORDER — EPHEDRINE SULFATE 50 MG/ML IJ SOLN
INTRAMUSCULAR | Status: DC | PRN
  Administered 2015-06-09: 15 mg via INTRAVENOUS
  Administered 2015-06-09: 10 mg via INTRAVENOUS
  Administered 2015-06-09: 15 mg via INTRAVENOUS
  Administered 2015-06-09: 10 mg via INTRAVENOUS
  Administered 2015-06-09: 5 mg via INTRAVENOUS

## 2015-06-09 MED ORDER — FENTANYL CITRATE (PF) 100 MCG/2ML IJ SOLN
INTRAMUSCULAR | Status: AC
  Filled 2015-06-09: qty 2

## 2015-06-09 MED ORDER — WHITE PETROLATUM GEL
Status: AC
  Filled 2015-06-09: qty 10

## 2015-06-09 MED ORDER — LIDOCAINE HCL (CARDIAC) 20 MG/ML IV SOLN
INTRAVENOUS | Status: DC | PRN
  Administered 2015-06-09: 100 mg via INTRAVENOUS

## 2015-06-09 MED ORDER — LIDOCAINE HCL (CARDIAC) 20 MG/ML IV SOLN
INTRAVENOUS | Status: AC
  Filled 2015-06-09: qty 5

## 2015-06-09 SURGICAL SUPPLY — 71 items
ADAPTER CATH URET PLST 4-6FR (CATHETERS) IMPLANT
BAG DRAIN URO-CYSTO SKYTR STRL (DRAIN) ×6 IMPLANT
BAG URINE DRAINAGE (UROLOGICAL SUPPLIES) ×6 IMPLANT
BAG URINE LEG 19OZ MD ST LTX (BAG) IMPLANT
BAG URINE LEG 500ML (DRAIN) IMPLANT
BALLN NEPHROSTOMY (BALLOONS) ×6
BALLOON NEPHROSTOMY (BALLOONS) ×4 IMPLANT
BASKET LASER NITINOL 1.9FR (BASKET) IMPLANT
BASKET STNLS GEMINI 4WIRE 3FR (BASKET) IMPLANT
BASKET ZERO TIP NITINOL 2.4FR (BASKET) IMPLANT
BENZOIN TINCTURE PRP APPL 2/3 (GAUZE/BANDAGES/DRESSINGS) IMPLANT
CANISTER SUCT LVC 12 LTR MEDI- (MISCELLANEOUS) IMPLANT
CATH FOLEY 2WAY SLVR  5CC 14FR (CATHETERS) ×2
CATH FOLEY 2WAY SLVR  5CC 16FR (CATHETERS)
CATH FOLEY 2WAY SLVR  5CC 18FR (CATHETERS)
CATH FOLEY 2WAY SLVR  5CC 20FR (CATHETERS)
CATH FOLEY 2WAY SLVR  5CC 22FR (CATHETERS)
CATH FOLEY 2WAY SLVR 5CC 14FR (CATHETERS) ×4 IMPLANT
CATH FOLEY 2WAY SLVR 5CC 16FR (CATHETERS) IMPLANT
CATH FOLEY 2WAY SLVR 5CC 18FR (CATHETERS) IMPLANT
CATH FOLEY 2WAY SLVR 5CC 20FR (CATHETERS) IMPLANT
CATH FOLEY 2WAY SLVR 5CC 22FR (CATHETERS) IMPLANT
CATH INTERMIT  6FR 70CM (CATHETERS) ×6 IMPLANT
CATH URET 5FR 28IN CONE TIP (BALLOONS) ×2
CATH URET 5FR 28IN OPEN ENDED (CATHETERS) IMPLANT
CATH URET 5FR 70CM CONE TIP (BALLOONS) ×4 IMPLANT
CLOTH BEACON ORANGE TIMEOUT ST (SAFETY) ×12 IMPLANT
DRSG TEGADERM 2-3/8X2-3/4 SM (GAUZE/BANDAGES/DRESSINGS) IMPLANT
ELECT BUTTON BIOP 24F 90D PLAS (MISCELLANEOUS) IMPLANT
ELECT REM PT RETURN 9FT ADLT (ELECTROSURGICAL) ×6
ELECTRODE REM PT RTRN 9FT ADLT (ELECTROSURGICAL) ×4 IMPLANT
EVACUATOR MICROVAS BLADDER (UROLOGICAL SUPPLIES) IMPLANT
FIBER LASER FLEXIVA 1000 (UROLOGICAL SUPPLIES) IMPLANT
FIBER LASER FLEXIVA 365 (UROLOGICAL SUPPLIES) IMPLANT
FIBER LASER FLEXIVA 550 (UROLOGICAL SUPPLIES) IMPLANT
FIBER LASER TRAC TIP (UROLOGICAL SUPPLIES) IMPLANT
GLOVE BIO SURGEON STRL SZ7.5 (GLOVE) ×6 IMPLANT
GLOVE SURG SS PI 7.5 STRL IVOR (GLOVE) ×12 IMPLANT
GOWN STRL REUS W/ TWL XL LVL3 (GOWN DISPOSABLE) ×4 IMPLANT
GOWN STRL REUS W/TWL LRG LVL3 (GOWN DISPOSABLE) ×6 IMPLANT
GOWN STRL REUS W/TWL XL LVL3 (GOWN DISPOSABLE) ×2
GOWN XL W/COTTON TOWEL STD (GOWNS) IMPLANT
GUIDEWIRE 0.038 PTFE COATED (WIRE) IMPLANT
GUIDEWIRE ANG ZIPWIRE 038X150 (WIRE) IMPLANT
GUIDEWIRE STR DUAL SENSOR (WIRE) ×6 IMPLANT
HOLDER FOLEY CATH W/STRAP (MISCELLANEOUS) ×6 IMPLANT
IV NS 1000ML (IV SOLUTION) ×2
IV NS 1000ML BAXH (IV SOLUTION) ×4 IMPLANT
IV NS IRRIG 3000ML ARTHROMATIC (IV SOLUTION) IMPLANT
KIT BALLIN UROMAX 15FX10 (LABEL) IMPLANT
KIT BALLN UROMAX 15FX4 (MISCELLANEOUS) IMPLANT
KIT BALLN UROMAX 26 75X4 (MISCELLANEOUS)
KIT ROOM TURNOVER WOR (KITS) ×6 IMPLANT
LOOP CUT BIPOLAR 24F LRG (ELECTROSURGICAL) ×6 IMPLANT
MANIFOLD NEPTUNE II (INSTRUMENTS) ×6 IMPLANT
NDL SAFETY ECLIPSE 18X1.5 (NEEDLE) IMPLANT
NEEDLE HYPO 18GX1.5 SHARP (NEEDLE)
NEEDLE HYPO 22GX1.5 SAFETY (NEEDLE) IMPLANT
NS IRRIG 500ML POUR BTL (IV SOLUTION) IMPLANT
PACK CYSTO (CUSTOM PROCEDURE TRAY) ×6 IMPLANT
PLUG CATH AND CAP STER (CATHETERS) IMPLANT
SET ASPIRATION TUBING (TUBING) IMPLANT
SET HIGH PRES BAL DIL (LABEL)
SHEATH ACCESS URETERAL 38CM (SHEATH) IMPLANT
SYR 20CC LL (SYRINGE) IMPLANT
SYR 30ML LL (SYRINGE) IMPLANT
SYRINGE 10CC LL (SYRINGE) ×6 IMPLANT
SYRINGE IRR TOOMEY STRL 70CC (SYRINGE) ×6 IMPLANT
TUBE CONNECTING 12'X1/4 (SUCTIONS)
TUBE CONNECTING 12X1/4 (SUCTIONS) IMPLANT
WATER STERILE IRR 3000ML UROMA (IV SOLUTION) ×6 IMPLANT

## 2015-06-09 NOTE — Addendum Note (Signed)
Addendum  created 06/09/15 1305 by Wanita Chamberlain, CRNA   Modules edited: Anesthesia Flowsheet

## 2015-06-09 NOTE — H&P (Signed)
History of Present Illness     Keith Giles presents today to reestablish in our office. He was last seen here about 2-1/2 years ago. He is currently 66 years young. He does have a longstanding history of some bladder neck obstruction and BPH. He has typically had a weak stream. He has tried both Cialis as well as alpha blockers in the past without any tremendous improvement. He also has a prior history of a hydrocele repaired by me. The last time I saw him, the situation was fairly stable. We told him to continue to be seen periodically, but he has not been back for any followup. He also does have a history of nephrolithiasis, and the last time he was here there did appear to be some stones, primarily in the lower pole of the left kidney, that appeared to be unchanged. We felt that these could be observed.  He tells me in the interim, he has been involved in the [place] system. He went through a whole battery of tests and was noted to have some microhematuria. For that reason, he had additional assessment. The 2D imaging revealed some bilateral nonobstructing renal stones with the largest being in the lower pole of the left kidney. These appeared to be in the 6-8 mm size range, but the majority of the stones were smaller. There also appeared to be a questionable tiny 2 mm calcification in the area of the distal left ureter, potentially in the intramural ureter, but there was no evidence of obstruction. He had a hyperdense renal cyst that appeared benign. He also had a diffusely thickened bladder consistent with outlet obstruction.  Keith Giles also underwent cystoscopy. I have reviewed the operative report from the urologist at the [facility] system. He was felt to have a 1.5 cm tumor on the posterior wall of his bladder. The urologist recommended that he undergo a cystoscopy with TURBT of the bladder tumor along with further evaluation of the left ureter for possible ureteral stone. Keith Giles wanted to get our opinion  and ultimately decided if something was done that he would prefer to have this done in [place].     Past Medical History Problems  1. History of Arthritis 2. History of hypercholesterolemia (Z86.39) 3. History of hypertension (Z86.79)  Surgical History Problems  1. History of Hernia Repair 2. History of Surgery Spermatic Cord Excision Of Hydrocele 3. History of Tonsillectomy 4. History of Total Hip Replacement 5. History of Total Hip Replacement  Current Meds 1. Aspirin 81 MG TABS;  Therapy: (Recorded:20Mar2008) to Recorded 2. Cialis 5 MG Oral Tablet;  Therapy: (Recorded:17Apr2017) to Recorded 3. HydroCHLOROthiazide TABS;  Therapy: (Recorded:20Mar2008) to Recorded 4. Lipitor TABS;  Therapy: (Recorded:10May2013) to Recorded 5. Tamsulosin HCl - 0.4 MG Oral Capsule;  Therapy: (Recorded:17Apr2017) to Recorded 6. Viberzi 100 MG Oral Tablet;  Therapy: (Recorded:17Apr2017) to Recorded  Allergies Medication  1. Anti-Hist CAPS  Family History Problems  1. Family history of Acute Myocardial Infarction : Father 2. Family history of Asthma : Mother 3. Family history of Death In The Family Father   58 4. Family history of Death In The Family Mother   30 5. Family history of Family Health Status Number Of Children   1 biological son that is deceased, but has 8 step children 6. Family history of Heart Disease : Father 19. Family history of Lung Cancer : Son 8. Family history of Pneumonia : Mother  Social History Problems  1. Alcohol Use (History)   rarely 2. Caffeine  Use   moderate amt of tea 3. Death In The Family Father   48yrs, MI 4. Death In The Family Mother   [id number], complications from asthma 5. Former Smoker   stopped smoking 4/10 6. Occupation:   retired  Review of Systems Genitourinary, constitutional, skin, eye, otolaryngeal, hematologic/lymphatic, cardiovascular, pulmonary, endocrine, musculoskeletal, gastrointestinal, neurological and  psychiatric system(s) were reviewed and pertinent findings if present are noted and are otherwise negative.  Genitourinary: urinary frequency, nocturia, difficulty starting the urinary stream, weak urinary stream, incomplete emptying of bladder and erectile dysfunction, but no dysuria and no hematuria.  Gastrointestinal: heartburn, diarrhea and constipation.  Constitutional: feeling tired (fatigue).  Integumentary: pruritus.  ENT: sinus problems.  Respiratory: cough.  Musculoskeletal: joint pain and joint swelling, but no back pain.  Neurological: dizziness.    Vitals Vital Signs [Data Includes: Last 1 Day]  Recorded: 17Apr2017 02:40PM  Height: 5 ft 11 in Weight: 174 lb  BMI Calculated: 24.27 BSA Calculated: 1.99 Blood Pressure: 123 / 74 Temperature: 97.6 F Heart Rate: 72  Physical Exam Constitutional: Well nourished and well developed . No acute distress.   Pulmonary: No respiratory distress and normal respiratory rhythm and effort.   Cardiovascular: Heart rate and rhythm are normal . No peripheral edema.   Rectal: Estimated prostate size is 1+. Normal rectal tone, no rectal masses, prostate is smooth, symmetric and non-tender. The prostate has no nodularity and is not indurated.   Genitourinary: Examination of the penis demonstrates hypospadias and meatal stenosis, but no discharge, no masses and no lesions. The scrotum is without lesions. The right epididymis is palpably normal and non-tender. The left epididymis is palpably normal and non-tender. The right testis is non-tender and without masses. The left testis is non-tender and without masses.   Skin: Normal skin turgor, no visible rash and no visible skin lesions.   Neuro/Psych:. Mood and affect are appropriate.    Assessment Assessed  1. 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 Calculus of ureter (N20.1) 3. Bladder calculus (N21.0) 4. Benign prostatic hyperplasia with urinary obstruction  (N40.1,N13.8) 5. Nephrolithiasis (N20.0)  Plan Cancer of posterior wall of urinary bladder  1. Follow-up Schedule Surgery Office  Follow-up  Status: Complete  Done: 17Apr2017  Discussion/Summary         Keith Giles has had some longstanding microhematuria. It is certainly not surprising, given his bilateral nonobstructing renal calculi. Cystoscopy recently revealed what was felt to be a 1.5 cm tumor. Their recommendation was to proceed with transurethral resection of the tumor along with further assessment of the bladder and evaluation of the distal ureter. That certainly seems very appropriate and reasonable due to requiring _____ at the time of cystoscopy. Keith Giles is interested in having a procedure done here in [place]. Assuming that they will cover the procedure for him, we would be delighted to help him out. I would anticipate outpatient procedure. We will plan on dilation of his hypospadias meatal stenosis and then proceed with careful cystoscopy. If a tumor or abnormal area is appreciated, I would either resect it transurethrally with TURBT instrumentation or potential cold-cup resection. We will plan on further evaluation of the distal left ureter with retrograde pyelography and ureteroscopy as necessary. Bladder stones will be removed as well.      Amendment Lavone Orn, MD   Signatures Electronically signed by : Rana Snare, M.D.; May 18 2015  7:32PM EST

## 2015-06-09 NOTE — Op Note (Signed)
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  Anesthesia: Gen.  Indications: Mr. Kerwin is 82 years of age. He recently reestablished in our office. He has had some long-standing issues with bladder neck obstruction and BPH. He had recently undergone an evaluation at the [place] because of some microhematuria. CT imaging had revealed some bilateral nonobstructing renal stones. There was also a question of a 2 mm distal left ureteral stone. The patient subsequently underwent cystoscopy at the [facility] and was felt to have a 1.5 cm tumor on the posterior wall of his bladder. There is also notation of multiple bladder calculi. The patient decided to have this procedure done in [place].     Technique and findings: Patient was brought the operating room where he had successful induction general anesthesia. He was placed in lithotomy position and prepped and draped the usual manner. He received perioperative antibiotics and appropriate surgical timeout was performed. The patient had distal hypospadias with some stenosis. He underwent dilation to 11 [place] with [employer] sounds. Cystoscopy revealed a unremarkable anterior urethra. The prostatic urethra measured approximate 4 cm and there was moderate trilobar hyperplasia with a fairly prominent median bar and visual obstruction. Within the bladder there were approximately 6-8 small 2-3 mm bladder calculi noted. There was also a 2 cm tumor involving the posterior wall of the bladder. This tumor appeared papillary and likely to be noninvasive. The bladder was moderately trabeculated with some degree of cellule formation.   Left retrograde  pyelography was performed. There was evidence of J hooking of the ureter and a questionable area of obstruction up proximally a centimeter above the ureterovesical junction. With ongoing time and fluoroscopic interpretation this area never drained well. For that reason we went ahead and placed a guidewire into the left renal pelvis and went ahead with rigid ureteroscopy with the needle tip rigid ureteroscope. There was no evidence of intermural pathology. No stones were appreciated. The guidewire was removed.   Attention was then turned towards tumor resection. A 26 French resectoscope with gyrus instrumentation was utilized. Saline was used as an irrigant. The posterior wall bladder tumor was then resected. There was no evidence of bladder perforation but that area did appear thinned in between 2 cellules. Hemostasis was quite good. There were a few areas on the bladder neck that were oozing from the instrumentation which were then fulgurated. I elected to place an 27 French Foley catheter because of some bladder wall thickening. Patient was brought to recovery room in stable condition. No obvious complications occurred.

## 2015-06-09 NOTE — Interval H&P Note (Signed)
History and Physical Interval Note:  06/09/2015 9:37 AM  Keith Giles  has presented today for surgery, with the diagnosis of BLADDER TUMOR, BLADDER CALCULUS, POSSIBLE URETERAL CALCULUS  The various methods of treatment have been discussed with the patient and family. After consideration of risks, benefits and other options for treatment, the patient has consented to  Procedure(s) with comments: CYSTOSCOPY WITH URETHRAL DILATATION (N/A) - MEATAL DILATION  POSSIBLE TRANSURETHRAL RESECTION OF BLADDER TUMOR (TURBT) (N/A) POSSIBLE CYSTOSCOPY WITH RETROGRADE PYELOGRAM, URETEROSCOPY AND STENT PLACEMENT (Left) as a surgical intervention .  The patient's history has been reviewed, patient examined, no change in status, stable for surgery.  I have reviewed the patient's chart and labs.  Questions were answered to the patient's satisfaction.     Anshu Wehner S

## 2015-06-09 NOTE — Anesthesia Preprocedure Evaluation (Addendum)
Anesthesia Evaluation  Patient identified by MRN, date of birth, ID band Patient awake    Reviewed: Allergy & Precautions, NPO status , Patient's Chart, lab work & pertinent test results  Airway Mallampati: II  TM Distance: >3 FB Neck ROM: Full    Dental  (+) Edentulous Upper, Partial Lower,    Pulmonary shortness of breath, asthma , COPD, Current Smoker,    Pulmonary exam normal breath sounds clear to auscultation       Cardiovascular hypertension, Pt. on medications Normal cardiovascular exam Rhythm:Regular Rate:Normal     Neuro/Psych PSYCHIATRIC DISORDERS negative neurological ROS     GI/Hepatic negative GI ROS, Neg liver ROS,   Endo/Other  negative endocrine ROS  Renal/GU negative Renal ROS  negative genitourinary   Musculoskeletal  (+) Arthritis ,   Abdominal   Peds negative pediatric ROS (+)  Hematology negative hematology ROS (+)   Anesthesia Other Findings   Reproductive/Obstetrics negative OB ROS                            Anesthesia Physical Anesthesia Plan  ASA: III  Anesthesia Plan: General   Post-op Pain Management:    Induction: Intravenous  Airway Management Planned: LMA  Additional Equipment:   Intra-op Plan:   Post-operative Plan: Extubation in OR  Informed Consent: I have reviewed the patients History and Physical, chart, labs and discussed the procedure including the risks, benefits and alternatives for the proposed anesthesia with the patient or authorized representative who has indicated his/her understanding and acceptance.   Dental advisory given  Plan Discussed with: CRNA  Anesthesia Plan Comments:         Anesthesia Quick Evaluation

## 2015-06-09 NOTE — Discharge Instructions (Addendum)
Transurethral Resection of Bladder Tumor (TURBT)   Definition:  Transurethral Resection of the Bladder Tumor is a surgical procedure used to diagnose and remove tumors within the bladder. TURBT is the most common treatment for early stage bladder cancer.  General instructions:     Your recent bladder surgery requires very little post hospital care but some definite precautions.  Despite the fact that no skin incisions were used, the area around the bladder incisions are raw and covered with scabs to promote healing and prevent bleeding. Certain precautions are needed to insure that the scabs are not disturbed over the next 2-4 weeks while the healing proceeds.  Because the raw surface inside your bladder and the irritating effects of urine you may expect frequency of urination and/or urgency (a stronger desire to urinate) and perhaps even getting up at night more often. This will usually resolve or improve slowly over the healing period. You may see some blood in your urine over the first 6 weeks. Do not be alarmed, even if the urine was clear for a while. Get off your feet and drink lots of fluids until clearing occurs. If you start to pass clots or don't improve call us.  Diet:  You may return to your normal diet immediately. Because of the raw surface of your bladder, alcohol, spicy foods, foods high in acid and drinks with caffeine may cause irritation or frequency and should be used in moderation. To keep your urine flowing freely and avoid constipation, drink plenty of fluids during the day (8-10 glasses). Tip: Avoid cranberry juice because it is very acidic.  Activity:  Your physical activity doesn't need to be restricted. However, if you are very active, you may see some blood in the urine. We suggest that you reduce your activity under the circumstances until the bleeding has stopped.  Bowels:  It is important to keep your bowels regular during the postoperative period. Straining  with bowel movements can cause bleeding. A bowel movement every other day is reasonable. Use a mild laxative if needed, such as milk of magnesia 2-3 tablespoons, or 2 Dulcolax tablets. Call if you continue to have problems. If you had been taking narcotics for pain, before, during or after your surgery, you may be constipated. Take a laxative if necessary.    Medication:  You should resume your pre-surgery medications unless told not to. In addition you may be given an antibiotic to prevent or treat infection. Antibiotics are not always necessary. All medication should be taken as prescribed until the bottles are finished unless you are having an unusual reaction to one of the drugs. You may restart your aspirin in 5 days       Foley Catheter Care, Adult A Foley catheter is a soft, flexible tube. This tube is placed into your bladder to drain pee (urine). If you go home with this catheter in place, follow the instructions below. TAKING CARE OF THE CATHETER 1. Wash your hands with soap and water. 2. Put soap and water on a clean washcloth.  Clean the skin where the tube goes into your body.  Clean away from the tube site.  Never wipe toward the tube.  Clean the area using a circular motion.  Remove all the soap. Pat the area dry with a clean towel. For males, reposition the skin that covers the end of the penis (foreskin). 3. Attach the tube to your leg with tape or a leg strap. Do not stretch the tube tight. If you  are using tape, remove any stickiness left behind by past tape you used. 4. Keep the drainage bag below your hips. Keep it off the floor. 5. Check your tube during the day. Make sure it is working and draining. Make sure the tube does not curl, twist, or bend. 6. Do not pull on the tube or try to take it out. TAKING CARE OF THE DRAINAGE BAGS You will have a large overnight drainage bag and a small leg bag. You may wear the overnight bag any time. Never wear the small bag at  night. Follow the directions below. Emptying the Drainage Bag Empty your drainage bag when it is  - full or at least 2-3 times a day. 1. Wash your hands with soap and water. 2. Keep the drainage bag below your hips. 3. Hold the dirty bag over the toilet or clean container. 4. Open the pour spout at the bottom of the bag. Empty the pee into the toilet or container. Do not let the pour spout touch anything. 5. Clean the pour spout with a gauze pad or cotton ball that has rubbing alcohol on it. 6. Close the pour spout. 7. Attach the bag to your leg with tape or a leg strap. 8. Wash your hands well. Changing the Drainage Bag Change your bag once a month or sooner if it starts to smell or look dirty.  1. Wash your hands with soap and water. 2. Pinch the rubber tube so that pee does not spill out. 3. Disconnect the catheter tube from the drainage tube at the connection valve. Do not let the tubes touch anything. 4. Clean the end of the catheter tube with an alcohol wipe. Clean the end of a the drainage tube with a different alcohol wipe. 5. Connect the catheter tube to the drainage tube of the clean drainage bag. 6. Attach the new bag to the leg with tape or a leg strap. Avoid attaching the new bag too tightly. 7. Wash your hands well. Cleaning the Drainage Bag 1. Wash your hands with soap and water. 2. Wash the bag in warm, soapy water. 3. Rinse the bag with warm water. 4. Fill the bag with a mixture of white vinegar and water (1 cup vinegar to 1 quart warm water [.2 liter vinegar to 1 liter warm water]). Close the bag and soak it for 30 minutes in the solution. 5. Rinse the bag with warm water. 6. Hang the bag to dry with the pour spout open and hanging downward. 7. Store the clean bag (once it is dry) in a clean plastic bag. 8. Wash your hands well. PREVENT INFECTION  Wash your hands before and after touching your tube.  Take showers every day. Wash the skin where the tube enters your  body. Do not take baths. Replace wet leg straps with dry ones, if this applies.  Do not use powders, sprays, or lotions on the genital area. Only use creams, lotions, or ointments as told by your doctor.  For females, wipe from front to back after going to the bathroom.  Drink enough fluids to keep your pee clear or pale yellow unless you are told not to have too much fluid (fluid restriction).  Do not let the drainage bag or tubing touch or lie on the floor.  Wear cotton underwear to keep the area dry. GET HELP IF:  Your pee is cloudy or smells unusually bad.  Your tube becomes clogged.  You are not draining pee into  the bag or your bladder feels full.  Your tube starts to leak. GET HELP RIGHT AWAY IF:  You have pain, puffiness (swelling), redness, or yellowish-white fluid (pus) where the tube enters the body.  You have pain in the belly (abdomen), legs, lower back, or bladder.  You have a fever.  You see blood fill the tube, or your pee is pink or red.  You feel sick to your stomach (nauseous), throw up (vomit), or have chills.  Your tube gets pulled out. MAKE SURE YOU:   Understand these instructions.  Will watch your condition.  Will get help right away if you are not doing well or get worse.   This information is not intended to replace advice given to you by your health care provider. Make sure you discuss any questions you have with your health care provider.   Document Released: 05/13/2012 Document Revised: 02/06/2014 Document Reviewed: 05/13/2012 Elsevier Interactive Patient Education 2016 [place] Instructions  Activity: Get plenty of rest for the remainder of the day. A responsible adult should stay with you for 24 hours following the procedure.  For the next 24 hours, DO NOT: -Drive a car -Paediatric nurse -Drink alcoholic beverages -Take any medication unless instructed by your physician -Make any legal  decisions or sign important papers.  Meals: Start with liquid foods such as gelatin or soup. Progress to regular foods as tolerated. Avoid greasy, spicy, heavy foods. If nausea and/or vomiting occur, drink only clear liquids until the nausea and/or vomiting subsides. Call your physician if vomiting continues.  Special Instructions/Symptoms: Your throat may feel dry or sore from the anesthesia or the breathing tube placed in your throat during surgery. If this causes discomfort, gargle with warm salt water. The discomfort should disappear within 24 hours.  If you had a scopolamine patch placed behind your ear for the management of post- operative nausea and/or vomiting:  1. The medication in the patch is effective for 72 hours, after which it should be removed.  Wrap patch in a tissue and discard in the trash. Wash hands thoroughly with soap and water. 2. You may remove the patch earlier than 72 hours if you experience unpleasant side effects which may include dry mouth, dizziness or visual disturbances. 3. Avoid touching the patch. Wash your hands with soap and water after contact with the patch.

## 2015-06-09 NOTE — Anesthesia Postprocedure Evaluation (Signed)
Anesthesia Post Note  Patient: Keith Giles  Procedure(s) Performed: Procedure(s) (LRB): CYSTOSCOPY WITH URETHRAL DILATATION (N/A)  TRANSURETHRAL RESECTION OF BLADDER TUMOR (TURBT) (N/A) CYSTOSCOPY WITH RETROGRADE PYELOGRAM (Left) URETEROSCOPY/ STONE EXTRACTION BLADDER (Left)  Patient location during evaluation: PACU Anesthesia Type: General Level of consciousness: awake and alert Pain management: pain level controlled Vital Signs Assessment: post-procedure vital signs reviewed and stable Respiratory status: spontaneous breathing, nonlabored ventilation, respiratory function stable and patient connected to nasal cannula oxygen Cardiovascular status: blood pressure returned to baseline and stable Postop Assessment: no signs of nausea or vomiting Anesthetic complications: no    Last Vitals:  Filed Vitals:   06/09/15 1045 06/09/15 1100  BP: 142/70 142/62  Pulse: 84 83  Temp:    Resp: 12 12    Last Pain: There were no vitals filed for this visit.               Paris Hohn J

## 2015-06-09 NOTE — Anesthesia Procedure Notes (Signed)
Procedure Name: LMA Insertion Date/Time: 06/09/2015 9:37 AM Performed by: Wanita Chamberlain Pre-anesthesia Checklist: Patient identified, Timeout performed, Emergency Drugs available, Suction available and Patient being monitored Patient Re-evaluated:Patient Re-evaluated prior to inductionOxygen Delivery Method: Circle system utilized Preoxygenation: Pre-oxygenation with 100% oxygen Intubation Type: IV induction Ventilation: Mask ventilation without difficulty LMA: LMA inserted LMA Size: 5.0 Number of attempts: 1 Tube secured with: Tape Dental Injury: Teeth and Oropharynx as per pre-operative assessment

## 2015-06-09 NOTE — Transfer of Care (Signed)
Immediate Anesthesia Transfer of Care Note  Patient: Keith Giles  Procedure(s) Performed: Procedure(s) with comments: CYSTOSCOPY WITH URETHRAL DILATATION (N/A) - MEATAL DILATION  POSSIBLE TRANSURETHRAL RESECTION OF BLADDER TUMOR (TURBT) (N/A) POSSIBLE CYSTOSCOPY WITH RETROGRADE PYELOGRAM, URETEROSCOPY AND STENT PLACEMENT (Left)  Patient Location: PACU  Anesthesia Type:General  Level of Consciousness: awake, alert , oriented and patient cooperative  Airway & Oxygen Therapy: Patient Spontanous Breathing and Patient connected to nasal cannula oxygen  Post-op Assessment: Report given to RN and Post -op Vital signs reviewed and stable  Post vital signs: Reviewed and stable  Last Vitals:  Filed Vitals:   06/09/15 0821  BP: 118/53  Pulse: 67  Temp: 36.6 C  Resp: 16    Last Pain: There were no vitals filed for this visit.    Patients Stated Pain Goal: 8 ([id number])  Complications: No apparent anesthesia complications

## 2015-06-10 ENCOUNTER — Encounter (HOSPITAL_BASED_OUTPATIENT_CLINIC_OR_DEPARTMENT_OTHER): Payer: Self-pay | Admitting: Urology

## 2015-06-11 DIAGNOSIS — C674 Malignant neoplasm of posterior wall of bladder: Secondary | ICD-10-CM | POA: Diagnosis not present

## 2015-07-13 ENCOUNTER — Ambulatory Visit
Admission: RE | Admit: 2015-07-13 | Discharge: 2015-07-13 | Disposition: A | Discharge status: Home / Self Care | Payer: Medicare Other | Source: Ambulatory Visit | Attending: Internal Medicine | Admitting: Internal Medicine

## 2015-07-13 ENCOUNTER — Other Ambulatory Visit: Payer: Self-pay | Admitting: Internal Medicine

## 2015-07-13 DIAGNOSIS — R61 Generalized hyperhidrosis: Secondary | ICD-10-CM

## 2015-07-13 DIAGNOSIS — I10 Essential (primary) hypertension: Secondary | ICD-10-CM | POA: Diagnosis not present

## 2015-07-13 DIAGNOSIS — R918 Other nonspecific abnormal finding of lung field: Secondary | ICD-10-CM | POA: Diagnosis not present

## 2015-07-13 DIAGNOSIS — R3912 Poor urinary stream: Secondary | ICD-10-CM | POA: Diagnosis not present

## 2015-07-13 DIAGNOSIS — N401 Enlarged prostate with lower urinary tract symptoms: Secondary | ICD-10-CM | POA: Diagnosis not present

## 2015-07-13 DIAGNOSIS — I700 Atherosclerosis of aorta: Secondary | ICD-10-CM | POA: Diagnosis not present

## 2015-07-13 DIAGNOSIS — K589 Irritable bowel syndrome without diarrhea: Secondary | ICD-10-CM | POA: Diagnosis not present

## 2015-07-13 DIAGNOSIS — C674 Malignant neoplasm of posterior wall of bladder: Secondary | ICD-10-CM | POA: Diagnosis not present

## 2015-07-15 DIAGNOSIS — H2513 Age-related nuclear cataract, bilateral: Secondary | ICD-10-CM | POA: Diagnosis not present

## 2015-07-15 DIAGNOSIS — Z0100 Encounter for examination of eyes and vision without abnormal findings: Secondary | ICD-10-CM | POA: Diagnosis not present

## 2015-08-17 DIAGNOSIS — L708 Other acne: Secondary | ICD-10-CM | POA: Diagnosis not present

## 2015-08-17 DIAGNOSIS — D229 Melanocytic nevi, unspecified: Secondary | ICD-10-CM | POA: Diagnosis not present

## 2015-08-17 DIAGNOSIS — L821 Other seborrheic keratosis: Secondary | ICD-10-CM | POA: Diagnosis not present

## 2015-08-17 DIAGNOSIS — D1800 Hemangioma unspecified site: Secondary | ICD-10-CM | POA: Diagnosis not present

## 2015-08-17 DIAGNOSIS — I788 Other diseases of capillaries: Secondary | ICD-10-CM | POA: Diagnosis not present

## 2015-08-17 DIAGNOSIS — L720 Epidermal cyst: Secondary | ICD-10-CM | POA: Diagnosis not present

## 2015-08-17 DIAGNOSIS — Z1283 Encounter for screening for malignant neoplasm of skin: Secondary | ICD-10-CM | POA: Diagnosis not present

## 2015-08-17 DIAGNOSIS — D485 Neoplasm of uncertain behavior of skin: Secondary | ICD-10-CM | POA: Diagnosis not present

## 2015-10-11 DIAGNOSIS — N529 Male erectile dysfunction, unspecified: Secondary | ICD-10-CM | POA: Diagnosis not present

## 2015-10-11 DIAGNOSIS — Z23 Encounter for immunization: Secondary | ICD-10-CM | POA: Diagnosis not present

## 2015-10-11 DIAGNOSIS — R634 Abnormal weight loss: Secondary | ICD-10-CM | POA: Diagnosis not present

## 2015-10-11 DIAGNOSIS — I10 Essential (primary) hypertension: Secondary | ICD-10-CM | POA: Diagnosis not present

## 2015-10-11 DIAGNOSIS — K589 Irritable bowel syndrome without diarrhea: Secondary | ICD-10-CM | POA: Diagnosis not present

## 2015-10-22 DIAGNOSIS — N401 Enlarged prostate with lower urinary tract symptoms: Secondary | ICD-10-CM | POA: Diagnosis not present

## 2015-10-22 DIAGNOSIS — Z8551 Personal history of malignant neoplasm of bladder: Secondary | ICD-10-CM | POA: Diagnosis not present

## 2015-10-22 DIAGNOSIS — R3912 Poor urinary stream: Secondary | ICD-10-CM | POA: Diagnosis not present

## 2015-10-22 DIAGNOSIS — R319 Hematuria, unspecified: Secondary | ICD-10-CM | POA: Diagnosis not present

## 2016-01-12 DIAGNOSIS — N401 Enlarged prostate with lower urinary tract symptoms: Secondary | ICD-10-CM | POA: Diagnosis not present

## 2016-01-12 DIAGNOSIS — I700 Atherosclerosis of aorta: Secondary | ICD-10-CM | POA: Diagnosis not present

## 2016-01-12 DIAGNOSIS — N529 Male erectile dysfunction, unspecified: Secondary | ICD-10-CM | POA: Diagnosis not present

## 2016-01-12 DIAGNOSIS — E7800 Pure hypercholesterolemia, unspecified: Secondary | ICD-10-CM | POA: Diagnosis not present

## 2016-01-12 DIAGNOSIS — R3916 Straining to void: Secondary | ICD-10-CM | POA: Diagnosis not present

## 2016-01-12 DIAGNOSIS — Z1389 Encounter for screening for other disorder: Secondary | ICD-10-CM | POA: Diagnosis not present

## 2016-01-12 DIAGNOSIS — Z0000 Encounter for general adult medical examination without abnormal findings: Secondary | ICD-10-CM | POA: Diagnosis not present

## 2016-01-12 DIAGNOSIS — I10 Essential (primary) hypertension: Secondary | ICD-10-CM | POA: Diagnosis not present

## 2016-01-12 DIAGNOSIS — K589 Irritable bowel syndrome without diarrhea: Secondary | ICD-10-CM | POA: Diagnosis not present

## 2016-02-28 DIAGNOSIS — R319 Hematuria, unspecified: Secondary | ICD-10-CM | POA: Diagnosis not present

## 2016-02-28 DIAGNOSIS — Z8551 Personal history of malignant neoplasm of bladder: Secondary | ICD-10-CM | POA: Diagnosis not present

## 2016-02-28 DIAGNOSIS — N5201 Erectile dysfunction due to arterial insufficiency: Secondary | ICD-10-CM | POA: Diagnosis not present

## 2016-02-28 DIAGNOSIS — N401 Enlarged prostate with lower urinary tract symptoms: Secondary | ICD-10-CM | POA: Diagnosis not present

## 2016-07-13 DIAGNOSIS — H2513 Age-related nuclear cataract, bilateral: Secondary | ICD-10-CM | POA: Diagnosis not present

## 2016-07-13 DIAGNOSIS — H524 Presbyopia: Secondary | ICD-10-CM | POA: Diagnosis not present

## 2016-07-19 DIAGNOSIS — K589 Irritable bowel syndrome without diarrhea: Secondary | ICD-10-CM | POA: Diagnosis not present

## 2016-07-19 DIAGNOSIS — N5201 Erectile dysfunction due to arterial insufficiency: Secondary | ICD-10-CM | POA: Diagnosis not present

## 2016-07-19 DIAGNOSIS — I10 Essential (primary) hypertension: Secondary | ICD-10-CM | POA: Diagnosis not present

## 2016-07-19 DIAGNOSIS — J449 Chronic obstructive pulmonary disease, unspecified: Secondary | ICD-10-CM | POA: Diagnosis not present

## 2016-07-19 DIAGNOSIS — N401 Enlarged prostate with lower urinary tract symptoms: Secondary | ICD-10-CM | POA: Diagnosis not present

## 2016-08-12 DIAGNOSIS — R42 Dizziness and giddiness: Secondary | ICD-10-CM | POA: Diagnosis not present

## 2016-08-12 DIAGNOSIS — R404 Transient alteration of awareness: Secondary | ICD-10-CM | POA: Diagnosis not present

## 2016-08-14 DIAGNOSIS — H40013 Open angle with borderline findings, low risk, bilateral: Secondary | ICD-10-CM | POA: Diagnosis not present

## 2016-08-14 DIAGNOSIS — H43813 Vitreous degeneration, bilateral: Secondary | ICD-10-CM | POA: Diagnosis not present

## 2016-08-14 DIAGNOSIS — H25013 Cortical age-related cataract, bilateral: Secondary | ICD-10-CM | POA: Diagnosis not present

## 2016-08-14 DIAGNOSIS — H2513 Age-related nuclear cataract, bilateral: Secondary | ICD-10-CM | POA: Diagnosis not present

## 2016-08-28 DIAGNOSIS — R3912 Poor urinary stream: Secondary | ICD-10-CM | POA: Diagnosis not present

## 2016-08-28 DIAGNOSIS — N401 Enlarged prostate with lower urinary tract symptoms: Secondary | ICD-10-CM | POA: Diagnosis not present

## 2016-08-28 DIAGNOSIS — Z8551 Personal history of malignant neoplasm of bladder: Secondary | ICD-10-CM | POA: Diagnosis not present

## 2016-10-05 DIAGNOSIS — H25811 Combined forms of age-related cataract, right eye: Secondary | ICD-10-CM | POA: Diagnosis not present

## 2016-10-05 DIAGNOSIS — H25011 Cortical age-related cataract, right eye: Secondary | ICD-10-CM | POA: Diagnosis not present

## 2016-10-05 DIAGNOSIS — H2511 Age-related nuclear cataract, right eye: Secondary | ICD-10-CM | POA: Diagnosis not present

## 2016-10-05 DIAGNOSIS — H25812 Combined forms of age-related cataract, left eye: Secondary | ICD-10-CM | POA: Diagnosis not present

## 2016-10-09 DIAGNOSIS — Z1283 Encounter for screening for malignant neoplasm of skin: Secondary | ICD-10-CM | POA: Diagnosis not present

## 2016-10-09 DIAGNOSIS — L821 Other seborrheic keratosis: Secondary | ICD-10-CM | POA: Diagnosis not present

## 2016-10-09 DIAGNOSIS — L814 Other melanin hyperpigmentation: Secondary | ICD-10-CM | POA: Diagnosis not present

## 2016-10-09 DIAGNOSIS — D485 Neoplasm of uncertain behavior of skin: Secondary | ICD-10-CM | POA: Diagnosis not present

## 2016-10-09 DIAGNOSIS — D1800 Hemangioma unspecified site: Secondary | ICD-10-CM | POA: Diagnosis not present

## 2016-11-20 DIAGNOSIS — I499 Cardiac arrhythmia, unspecified: Secondary | ICD-10-CM | POA: Diagnosis not present

## 2016-11-24 DIAGNOSIS — Z23 Encounter for immunization: Secondary | ICD-10-CM | POA: Diagnosis not present

## 2016-12-27 DIAGNOSIS — H401123 Primary open-angle glaucoma, left eye, severe stage: Secondary | ICD-10-CM | POA: Diagnosis not present

## 2016-12-27 DIAGNOSIS — H0100B Unspecified blepharitis left eye, upper and lower eyelids: Secondary | ICD-10-CM | POA: Diagnosis not present

## 2016-12-27 DIAGNOSIS — H2512 Age-related nuclear cataract, left eye: Secondary | ICD-10-CM | POA: Diagnosis not present

## 2016-12-27 DIAGNOSIS — H0100A Unspecified blepharitis right eye, upper and lower eyelids: Secondary | ICD-10-CM | POA: Diagnosis not present

## 2017-01-12 DIAGNOSIS — J449 Chronic obstructive pulmonary disease, unspecified: Secondary | ICD-10-CM | POA: Diagnosis not present

## 2017-01-12 DIAGNOSIS — I700 Atherosclerosis of aorta: Secondary | ICD-10-CM | POA: Diagnosis not present

## 2017-01-12 DIAGNOSIS — K589 Irritable bowel syndrome without diarrhea: Secondary | ICD-10-CM | POA: Diagnosis not present

## 2017-01-12 DIAGNOSIS — N401 Enlarged prostate with lower urinary tract symptoms: Secondary | ICD-10-CM | POA: Diagnosis not present

## 2017-01-12 DIAGNOSIS — N529 Male erectile dysfunction, unspecified: Secondary | ICD-10-CM | POA: Diagnosis not present

## 2017-01-12 DIAGNOSIS — Z1389 Encounter for screening for other disorder: Secondary | ICD-10-CM | POA: Diagnosis not present

## 2017-01-12 DIAGNOSIS — E7800 Pure hypercholesterolemia, unspecified: Secondary | ICD-10-CM | POA: Diagnosis not present

## 2017-01-12 DIAGNOSIS — Z0000 Encounter for general adult medical examination without abnormal findings: Secondary | ICD-10-CM | POA: Diagnosis not present

## 2017-01-12 DIAGNOSIS — I10 Essential (primary) hypertension: Secondary | ICD-10-CM | POA: Diagnosis not present

## 2017-02-02 DIAGNOSIS — H401123 Primary open-angle glaucoma, left eye, severe stage: Secondary | ICD-10-CM | POA: Diagnosis not present

## 2017-03-13 DIAGNOSIS — C61 Malignant neoplasm of prostate: Secondary | ICD-10-CM | POA: Diagnosis not present

## 2017-03-20 DIAGNOSIS — N5201 Erectile dysfunction due to arterial insufficiency: Secondary | ICD-10-CM | POA: Diagnosis not present

## 2017-03-20 DIAGNOSIS — N200 Calculus of kidney: Secondary | ICD-10-CM | POA: Diagnosis not present

## 2017-03-20 DIAGNOSIS — C674 Malignant neoplasm of posterior wall of bladder: Secondary | ICD-10-CM | POA: Diagnosis not present

## 2017-03-20 DIAGNOSIS — Z125 Encounter for screening for malignant neoplasm of prostate: Secondary | ICD-10-CM | POA: Diagnosis not present

## 2017-03-20 DIAGNOSIS — R3912 Poor urinary stream: Secondary | ICD-10-CM | POA: Diagnosis not present

## 2017-06-05 DIAGNOSIS — H2512 Age-related nuclear cataract, left eye: Secondary | ICD-10-CM | POA: Diagnosis not present

## 2017-06-05 DIAGNOSIS — H401123 Primary open-angle glaucoma, left eye, severe stage: Secondary | ICD-10-CM | POA: Diagnosis not present

## 2017-06-05 DIAGNOSIS — H25012 Cortical age-related cataract, left eye: Secondary | ICD-10-CM | POA: Diagnosis not present

## 2017-06-05 DIAGNOSIS — H52203 Unspecified astigmatism, bilateral: Secondary | ICD-10-CM | POA: Diagnosis not present

## 2017-06-24 ENCOUNTER — Other Ambulatory Visit: Payer: Self-pay

## 2017-06-24 ENCOUNTER — Emergency Department (HOSPITAL_COMMUNITY)
Admission: EM | Admit: 2017-06-24 | Discharge: 2017-06-24 | Disposition: A | Discharge status: Home / Self Care | Payer: Medicare Other | Attending: Emergency Medicine | Admitting: Emergency Medicine

## 2017-06-24 ENCOUNTER — Encounter (HOSPITAL_COMMUNITY): Payer: Self-pay | Admitting: Emergency Medicine

## 2017-06-24 ENCOUNTER — Emergency Department (HOSPITAL_COMMUNITY): Payer: Medicare Other

## 2017-06-24 DIAGNOSIS — F17210 Nicotine dependence, cigarettes, uncomplicated: Secondary | ICD-10-CM | POA: Insufficient documentation

## 2017-06-24 DIAGNOSIS — I10 Essential (primary) hypertension: Secondary | ICD-10-CM | POA: Insufficient documentation

## 2017-06-24 DIAGNOSIS — R110 Nausea: Secondary | ICD-10-CM | POA: Diagnosis not present

## 2017-06-24 DIAGNOSIS — Z79899 Other long term (current) drug therapy: Secondary | ICD-10-CM | POA: Insufficient documentation

## 2017-06-24 DIAGNOSIS — J449 Chronic obstructive pulmonary disease, unspecified: Secondary | ICD-10-CM | POA: Insufficient documentation

## 2017-06-24 DIAGNOSIS — I481 Persistent atrial fibrillation: Secondary | ICD-10-CM | POA: Diagnosis not present

## 2017-06-24 DIAGNOSIS — I4891 Unspecified atrial fibrillation: Secondary | ICD-10-CM | POA: Diagnosis not present

## 2017-06-24 DIAGNOSIS — D090 Carcinoma in situ of bladder: Secondary | ICD-10-CM | POA: Insufficient documentation

## 2017-06-24 DIAGNOSIS — R002 Palpitations: Secondary | ICD-10-CM | POA: Diagnosis not present

## 2017-06-24 LAB — CBC
HEMATOCRIT: 49 % (ref 39.0–52.0)
HEMOGLOBIN: 16 g/dL (ref 13.0–17.0)
MCH: 31.2 pg (ref 26.0–34.0)
MCHC: 32.7 g/dL (ref 30.0–36.0)
MCV: 95.5 fL (ref 78.0–100.0)
Platelets: 257 10*3/uL (ref 150–400)
RBC: 5.13 MIL/uL (ref 4.22–5.81)
RDW: 14 % (ref 11.5–15.5)
WBC: 8.8 10*3/uL (ref 4.0–10.5)

## 2017-06-24 LAB — BASIC METABOLIC PANEL
Anion gap: 10 (ref 5–15)
BUN: 18 mg/dL (ref 6–20)
CHLORIDE: 102 mmol/L (ref 101–111)
CO2: 23 mmol/L (ref 22–32)
Calcium: 9 mg/dL (ref 8.9–10.3)
Creatinine, Ser: 1.2 mg/dL (ref 0.61–1.24)
GFR calc non Af Amer: 55 mL/min — ABNORMAL LOW (ref >60)
Glucose, Bld: 106 mg/dL — ABNORMAL HIGH (ref 65–99)
Potassium: 4 mmol/L (ref 3.5–5.1)
SODIUM: 135 mmol/L (ref 135–145)

## 2017-06-24 LAB — I-STAT TROPONIN, ED: Troponin i, poc: 0.01 ng/mL (ref 0.00–0.08)

## 2017-06-24 MED ORDER — SODIUM CHLORIDE 0.9 % IV BOLUS
500.0000 mL | Freq: Once | INTRAVENOUS | Status: AC
  Administered 2017-06-24: 500 mL via INTRAVENOUS

## 2017-06-24 MED ORDER — APIXABAN 5 MG PO TABS
5.0000 mg | ORAL_TABLET | Freq: Once | ORAL | Status: DC
  Filled 2017-06-24: qty 1

## 2017-06-24 MED ORDER — APIXABAN 5 MG PO TABS: 5.0000 mg | ORAL_TABLET | Freq: Two times a day (BID) | ORAL | 0 refills | Status: DC

## 2017-06-24 MED ORDER — METOPROLOL TARTRATE 25 MG PO TABS
25.0000 mg | ORAL_TABLET | Freq: Once | ORAL | Status: DC
  Filled 2017-06-24: qty 1

## 2017-06-24 MED ORDER — METOPROLOL TARTRATE 50 MG PO TABS: 25.0000 mg | ORAL_TABLET | Freq: Two times a day (BID) | ORAL | 0 refills | Status: DC

## 2017-06-24 NOTE — ED Notes (Deleted)
Trauma paged to RN Larkin Ina per his request

## 2017-06-24 NOTE — ED Provider Notes (Addendum)
[facility] EMERGENCY DEPARTMENT Provider Note   CSN: [id number] Arrival date & time: 06/24/17  6269   Patient is a vague historian  History   Chief Complaint Chief Complaint  Patient presents with  . Palpitations  . Nausea    HPI Keith Giles is a 84 y.o. male.patient felt his heart racing at approximately 1 AM today. Symptoms lasted for less than 30 minutes accompanied by nausea. Symptoms have resolved. He denies any shortness of breath denies chest pain. Denies other associated symptoms. He's never had similar symptoms before. No recent changes in his medications.and nothing makes symptoms better or worse. Symptoms resolved spontaneously without treatment  HPI  Past Medical History:  Diagnosis Date  . Arthritis   . Bladder stone   . Bladder tumor   . BPH (benign prostatic hyperplasia)   . Chronic asthmatic bronchitis ([place])   . COPD (chronic obstructive pulmonary disease) ([place])   . Diverticulosis of colon   . Full dentures   . History of adenomatous polyp of colon    2002 HYPERPLASTIC  . History of TIA (transient ischemic attack)    PER PT STATES HAD TIA SEVERAL YRS NO RESIDUAL AND DID NOT SEEK MEDICAL ATTENTION  . Hyperlipidemia   . Hypertension   . IBS (irritable bowel syndrome)   . Microhematuria   . Milk intolerance   . Right inguinal hernia   . Shortness of breath   . Urgency of urination   . Wears glasses   . Wears hearing aid    BILATERAL    Patient Active Problem List   Diagnosis Date Noted  . Cancer of posterior wall of urinary bladder ([place]) 06/09/2015  . Bladder calculi 06/09/2015  . NICOTINE ADDICTION 09/27/2007  . HYPERLIPIDEMIA 12/18/2006  . HYPERTENSION 12/18/2006  . BRONCHITIS 12/18/2006  . C O P D 12/18/2006    Past Surgical History:  Procedure Laterality Date  . CARDIAC CATHETERIZATION  2000   luminal irregularities LAD, CFX, and RCA  no high grade obstruction  . CARDIOVASCULAR STRESS TEST  02-06-2002   fixed mild  thinning of the inferior wall w/ no reversible defects identified, no ischemia/  normal LV function and wall motion, ef 69%  . COLONOSCOPY N/A 02/11/2013   Procedure: COLONOSCOPY;  Surgeon: Garlan Fair, MD;  Location: [facility];  Service: Endoscopy;  Laterality: N/A;  . CYSTOSCOPY W/ RETROGRADES Left 06/09/2015   Procedure: CYSTOSCOPY WITH RETROGRADE PYELOGRAM;  Surgeon: Rana Snare, MD;  Location[facility];  Service: Urology;  Laterality: Left;  . CYSTOSCOPY WITH URETHRAL DILATATION N/A 06/09/2015   Procedure: CYSTOSCOPY WITH URETHRAL DILATATION;  Surgeon: Rana Snare, MD;  Location: [facility];  Service: Urology;  Laterality: N/A;  . HYDROCELE EXCISION Left 01-03-2006  . INGUINAL HERNIA REPAIR Left 1961  . TONSILLECTOMY    . TRANSURETHRAL RESECTION OF BLADDER TUMOR N/A 06/09/2015   Procedure:  TRANSURETHRAL RESECTION OF BLADDER TUMOR (TURBT);  Surgeon: Rana Snare, MD;  Location: [facility];  Service: Urology;  Laterality: N/A;  . URETEROSCOPY Left 06/09/2015   Procedure: URETEROSCOPY/ STONE EXTRACTION BLADDER;  Surgeon: Rana Snare, MD;  Location: [facility];  Service: Urology;  Laterality: Left;        Home Medications    Prior to Admission medications   Medication Sig Start Date End Date Taking? Authorizing Provider  atorvastatin (LIPITOR) 20 MG tablet Take 20 mg by mouth every evening.     [provider]  Eluxadoline (  VIBERZI) 100 MG TABS Take by mouth every morning. PT TAKES I/3 OF TABLET    [provider]  hydrochlorothiazide (MICROZIDE) 12.5 MG capsule Take 12.5 mg by mouth every morning.     [provider]  HYDROcodone-acetaminophen (NORCO/VICODIN) 5-325 MG tablet Take 1-2 tablets by mouth every 6 (six) hours as needed. 06/09/15   Rana Snare, MD  tamsulosin (FLOMAX) 0.4 MG CAPS capsule Take 0.4 mg by mouth daily.    [provider]    Family History No family  history on file.  Social History Social History   Tobacco Use  . Smoking status: Current Every Day Smoker    Packs/day: 0.25    Years: 60.00    Pack years: 15.00    Types: Cigarettes  . Smokeless tobacco: Never Used  . Tobacco comment: 1 [place] TO 2 WKS  Substance Use Topics  . Alcohol use: Yes    Comment: occasional  . Drug use: No     Allergies   Antihistamines, diphenhydramine-type and Sulfa antibiotics   Review of Systems Review of Systems  Cardiovascular: Positive for palpitations.  Gastrointestinal: Positive for nausea.  All other systems reviewed and are negative.    Physical Exam Updated Vital Signs BP 96/73   Pulse 80   Temp 97.6 F (36.4 C) (Oral)   Resp 15   Ht 5\' 11"  (1.803 m)   Wt 75.3 kg (166 lb)   SpO2 98%   BMI 23.15 kg/m   Physical Exam  Constitutional: He appears well-developed and well-nourished.  HENT:  Head: Normocephalic and atraumatic.  Eyes: Pupils are equal, round, and reactive to light. Conjunctivae are normal.  Neck: Neck supple. No tracheal deviation present. No thyromegaly present.  Cardiovascular: Normal rate.  No murmur heard. irregular  Pulmonary/Chest: Effort normal and breath sounds normal.  Abdominal: Soft. Bowel sounds are normal. He exhibits no distension. There is no tenderness.  Musculoskeletal: Normal range of motion. He exhibits no edema or tenderness.  Neurological: He is alert. Coordination normal.  Skin: Skin is warm and dry. No rash noted.  Psychiatric: He has a normal mood and affect.  Nursing note and vitals reviewed.    ED Treatments / Results  Labs (all labs ordered are listed, but only abnormal results are displayed) Labs Reviewed  BASIC METABOLIC PANEL - Abnormal; Notable for the following components:      Result Value   Glucose, Bld 106 (*)    GFR calc non Af Amer 55 (*)    All other components within normal limits  CBC  I-STAT TROPONIN, ED    EKG None  EKG  Interpretation  Date/Time:  Sunday Jun 24 2017 10:49:08 EDT Ventricular Rate:  93 PR Interval:    QRS Duration: 151 QT Interval:  404 QTC Calculation: 455 R Axis:   70 Text Interpretation:  Atrial fibrillation Right bundle branch block Baseline wander in lead(s) V2 Since last tracing rate slower Confirmed by Orlie Dakin [phone]) on 06/24/2017 11:08:57 AM     ED ECG REPORT   Date: 06/24/2017 901 am  Rate: 115  Rhythm: atrial fibrillation  QRS Axis: normal  Intervals: normal  ST/T Wave abnormalities: nonspecific T wave changes  Conduction Disutrbances:right bundle branch block  Narrative Interpretation:   Old EKG Reviewed: atrial fibrillation new since previous tracing of 06/09/15  I have personally reviewed the EKG tracing and agree with the computerized printout as noted. Radiology Dg Chest 2 View  Result Date: 06/24/2017 CLINICAL DATA:  Palpitations. EXAM: CHEST -  2 VIEW COMPARISON:  07/13/2015 FINDINGS: Normal heart size. Aortic atherosclerosis identified. No pleural effusion or edema. No airspace opacities. Spondylosis noted within the thoracic spine. IMPRESSION: 1. No acute findings. 2.  Aortic Atherosclerosis (ICD10-I70.0). Electronically Signed   By: Kerby Moors M.D.   On: 06/24/2017 09:40    Procedures Procedures (including critical care time)  Medications Ordered in ED Medications  sodium chloride 0.9 % bolus 500 mL (has no administration in time range)   Results for orders placed or performed during the hospital encounter of 14/43/15  Basic metabolic panel  Result Value Ref Range   Sodium 135 135 - 145 mmol/L   Potassium 4.0 3.5 - 5.1 mmol/L   Chloride 102 101 - 111 mmol/L   CO2 23 22 - 32 mmol/L   Glucose, Bld 106 (H) 65 - 99 mg/dL   BUN 18 6 - 20 mg/dL   Creatinine, Ser 1.20 0.61 - 1.24 mg/dL   Calcium 9.0 8.9 - 10.3 mg/dL   GFR calc non Af Amer 55 (L) >60 mL/min   GFR calc Af Amer >60 >60 mL/min   Anion gap 10 5 - 15  CBC  Result Value Ref Range    WBC 8.8 4.0 - 10.5 K/uL   RBC 5.13 4.22 - 5.81 MIL/uL   Hemoglobin 16.0 13.0 - 17.0 g/dL   HCT 49.0 39.0 - 52.0 %   MCV 95.5 78.0 - 100.0 fL   MCH 31.2 26.0 - 34.0 pg   MCHC 32.7 30.0 - 36.0 g/dL   RDW 14.0 11.5 - 15.5 %   Platelets 257 150 - 400 K/uL  I-stat troponin, ED  Result Value Ref Range   Troponin i, poc 0.01 0.00 - 0.08 ng/mL   Comment 3           Dg Chest 2 View  Result Date: 06/24/2017 CLINICAL DATA:  Palpitations. EXAM: CHEST - 2 VIEW COMPARISON:  07/13/2015 FINDINGS: Normal heart size. Aortic atherosclerosis identified. No pleural effusion or edema. No airspace opacities. Spondylosis noted within the thoracic spine. IMPRESSION: 1. No acute findings. 2.  Aortic Atherosclerosis (ICD10-I70.0). Electronically Signed   By: Kerby Moors M.D.   On: 06/24/2017 09:40  chest x-ray viewed by me Lab work unremarkable Results for orders placed or performed during the hospital encounter of 40/08/67  Basic metabolic panel  Result Value Ref Range   Sodium 135 135 - 145 mmol/L   Potassium 4.0 3.5 - 5.1 mmol/L   Chloride 102 101 - 111 mmol/L   CO2 23 22 - 32 mmol/L   Glucose, Bld 106 (H) 65 - 99 mg/dL   BUN 18 6 - 20 mg/dL   Creatinine, Ser 1.20 0.61 - 1.24 mg/dL   Calcium 9.0 8.9 - 10.3 mg/dL   GFR calc non Af Amer 55 (L) >60 mL/min   GFR calc Af Amer >60 >60 mL/min   Anion gap 10 5 - 15  CBC  Result Value Ref Range   WBC 8.8 4.0 - 10.5 K/uL   RBC 5.13 4.22 - 5.81 MIL/uL   Hemoglobin 16.0 13.0 - 17.0 g/dL   HCT 49.0 39.0 - 52.0 %   MCV 95.5 78.0 - 100.0 fL   MCH 31.2 26.0 - 34.0 pg   MCHC 32.7 30.0 - 36.0 g/dL   RDW 14.0 11.5 - 15.5 %   Platelets 257 150 - 400 K/uL  I-stat troponin, ED  Result Value Ref Range   Troponin i, poc 0.01 0.00 - 0.08 ng/mL  Comment 3           Dg Chest 2 View  Result Date: 06/24/2017 CLINICAL DATA:  Palpitations. EXAM: CHEST - 2 VIEW COMPARISON:  07/13/2015 FINDINGS: Normal heart size. Aortic atherosclerosis identified. No pleural effusion  or edema. No airspace opacities. Spondylosis noted within the thoracic spine. IMPRESSION: 1. No acute findings. 2.  Aortic Atherosclerosis (ICD10-I70.0). Electronically Signed   By: Kerby Moors M.D.   On: 06/24/2017 09:40    Initial Impression / Assessment and Plan / ED Course  I have reviewed the triage vital signs and the nursing notes.  Pertinent labs & imaging results that were available during my care of the patient were reviewed by me and considered in my medical decision making (see chart for details).     Patient mildly lightheaded on standing Dr. Wynonia Lawman from cardiology service consulted and will evaluate patient in the ED  Dr. [name] from [facility] service evaluated patient in ED and deemed that he is fit for discharge. He requests prescriptions eliquismEq twice a day and metoprolol 25 g twice a day 1 monthand office will contact patient for follow-up. Aspirin will be discontinued Final Clinical Impressions(s) / ED Diagnoses  Diagnosis atrial fibrillation Final diagnoses:  None    ED Discharge Orders    None       Orlie Dakin, MD 06/24/17 [place], MD 06/24/17 1312

## 2017-06-24 NOTE — ED Triage Notes (Signed)
Pt. Stated, Keith Giles been having high heart rate since this morning , Ive also had some nausea.

## 2017-06-24 NOTE — Discharge Instructions (Signed)
The cardiology office will contact you to arrange follow-up in the office. If you don't hear from the office by Friday, 06/29/2017, call to schedule an office appointment. Tell office staff that Dr. [name] saw you in the emergency department. STOP TAKING ASPIRIN

## 2017-06-24 NOTE — Consult Note (Signed)
Cardiology Consultation:   Patient ID: Keith Giles; [id number]; 1933/09/23   Admit date: 06/24/2017 Date of Consult: 06/24/2017  Primary Care Provider: Lavone Orn, MD Primary Cardiologist: No primary care provider on file. none Primary Electrophysiologist:  none   Patient Profile:   Keith Giles is a 84 y.o. male with a hx of HTN and BPH who is being seen today for the evaluation of atrial fib at the request of Dr. Winfred Leeds. .  History of Present Illness:   Keith Giles is an 84 yo man who has been healthy. He works part time at a golf course. He denies chest pain, sob, or syncope. He was in his usual state of health until last night when he noted that his heart rate was increased. He did not feel badly, but a little anxious. He presents today for evaluation. No edema.   Past Medical History:  Diagnosis Date  . Arthritis   . Bladder stone   . Bladder tumor   . BPH (benign prostatic hyperplasia)   . Chronic asthmatic bronchitis ([place])   . COPD (chronic obstructive pulmonary disease) ([place])   . Diverticulosis of colon   . Full dentures   . History of adenomatous polyp of colon    2002 HYPERPLASTIC  . History of TIA (transient ischemic attack)    PER PT STATES HAD TIA SEVERAL YRS NO RESIDUAL AND DID NOT SEEK MEDICAL ATTENTION  . Hyperlipidemia   . Hypertension   . IBS (irritable bowel syndrome)   . Microhematuria   . Milk intolerance   . Right inguinal hernia   . Shortness of breath   . Urgency of urination   . Wears glasses   . Wears hearing aid    BILATERAL    Past Surgical History:  Procedure Laterality Date  . CARDIAC CATHETERIZATION  2000   luminal irregularities LAD, CFX, and RCA  no high grade obstruction  . CARDIOVASCULAR STRESS TEST  02-06-2002   fixed mild thinning of the inferior wall w/ no reversible defects identified, no ischemia/  normal LV function and wall motion, ef 69%  . COLONOSCOPY N/A 02/11/2013   Procedure: COLONOSCOPY;  Surgeon: Garlan Fair, MD;  Location: [facility];  Service: Endoscopy;  Laterality: N/A;  . CYSTOSCOPY W/ RETROGRADES Left 06/09/2015   Procedure: CYSTOSCOPY WITH RETROGRADE PYELOGRAM;  Surgeon: Rana Snare, MD;  Location: [facility];  Service: Urology;  Laterality: Left;  . CYSTOSCOPY WITH URETHRAL DILATATION N/A 06/09/2015   Procedure: CYSTOSCOPY WITH URETHRAL DILATATION;  Surgeon: Rana Snare, MD;  Location[facility];  Service: Urology;  Laterality: N/A;  . HYDROCELE EXCISION Left 01-03-2006  . INGUINAL HERNIA REPAIR Left 1961  . TONSILLECTOMY    . TRANSURETHRAL RESECTION OF BLADDER TUMOR N/A 06/09/2015   Procedure:  TRANSURETHRAL RESECTION OF BLADDER TUMOR (TURBT);  Surgeon: Rana Snare, MD;  Location: [facility];  Service: Urology;  Laterality: N/A;  . URETEROSCOPY Left 06/09/2015   Procedure: URETEROSCOPY/ STONE EXTRACTION BLADDER;  Surgeon: Rana Snare, MD;  Location: [facility];  Service: Urology;  Laterality: Left;     Home Medications:  Prior to Admission medications   Medication Sig Start Date End Date Taking? Authorizing Provider  atorvastatin (LIPITOR) 20 MG tablet Take 20 mg by mouth every evening.     [provider]  Eluxadoline (VIBERZI) 100 MG TABS Take by mouth every morning. PT TAKES I/3 OF TABLET    [provider]  hydrochlorothiazide (MICROZIDE) 12.5  MG capsule Take 12.5 mg by mouth every morning.     [provider]  HYDROcodone-acetaminophen (NORCO/VICODIN) 5-325 MG tablet Take 1-2 tablets by mouth every 6 (six) hours as needed. 06/09/15   Rana Snare, MD  tamsulosin (FLOMAX) 0.4 MG CAPS capsule Take 0.4 mg by mouth daily.    [provider]    Inpatient Medications: Scheduled Meds:  Continuous Infusions:  PRN Meds:   Allergies:    Allergies  Allergen Reactions  . Antihistamines, Diphenhydramine-Type Other (See Comments)    Difficulty  voiding  . Sulfa Antibiotics  Rash    Social History:   Social History   Socioeconomic History  . Marital status: Married    Spouse name: Not on file  . Number of children: Not on file  . Years of education: Not on file  . Highest education level: Not on file  Occupational History  . Not on file  Social Needs  . Financial resource strain: Not on file  . Food insecurity:    Worry: Not on file    Inability: Not on file  . Transportation needs:    Medical: Not on file    Non-medical: Not on file  Tobacco Use  . Smoking status: Current Every Day Smoker    Packs/day: 0.25    Years: 60.00    Pack years: 15.00    Types: Cigarettes  . Smokeless tobacco: Never Used  . Tobacco comment: 1 [place] TO 2 WKS  Substance and Sexual Activity  . Alcohol use: Yes    Comment: occasional  . Drug use: No  . Sexual activity: Not on file  Lifestyle  . Physical activity:    Days per week: Not on file    Minutes per session: Not on file  . Stress: Not on file  Relationships  . Social connections:    Talks on phone: Not on file    Gets together: Not on file    Attends religious service: Not on file    Active member of club or organization: Not on file    Attends meetings of clubs or organizations: Not on file    Relationship status: Not on file  . Intimate partner violence:    Fear of current or ex partner: Not on file    Emotionally abused: Not on file    Physically abused: Not on file    Forced sexual activity: Not on file  Other Topics Concern  . Not on file  Social History Narrative  . Not on file    Family History:   Brother has an ICM  ROS:  Please see the history of present illness.  Otherwise negative  All other ROS reviewed and negative.     Physical Exam/Data:   Vitals:   06/24/17 1130 06/24/17 1145 06/24/17 1200 06/24/17 1215  BP: 98/70 118/87 117/84 119/60  Pulse: 72 90 74 82  Resp: 15 15 13 12   Temp:      TempSrc:      SpO2: 94% 95% 95% 96%  Weight:      Height:       No intake or  output data in the 24 hours ending 06/24/17 1242 Filed Weights   06/24/17 0858  Weight: 166 lb (75.3 kg)   Body mass index is 23.15 kg/m.  General:  Well nourished, well developed, in no acute distress HEENT: normal Lymph: no adenopathy Neck: 6cm JVD Endocrine:  No thryomegaly Vascular: No carotid bruits; FA pulses 2+ bilaterally without bruits  Cardiac:  normal S1, S2; IRIRR; no murmur  Lungs:  clear to auscultation bilaterally, no wheezing, rhonchi or rales  Abd: soft, nontender, no hepatomegaly  Ext: no edema Musculoskeletal:  No deformities, BUE and BLE strength normal and equal Skin: warm and dry  Neuro:  CNs 2-12 intact, no focal abnormalities noted Psych:  Normal affect   EKG:  The EKG was personally reviewed and demonstrates:  Atrial fibrillation with a controlled VR Telemetry:  Telemetry was personally reviewed and demonstrates:  Atrial fib with a controlled VR  Relevant CV Studies: none  Laboratory Data:  Chemistry Recent Labs  Lab 06/24/17 0926  NA 135  K 4.0  CL 102  CO2 23  GLUCOSE 106*  BUN 18  CREATININE 1.20  CALCIUM 9.0  GFRNONAA 55*  GFRAA >60  ANIONGAP 10    No results for input(s): PROT, ALBUMIN, AST, ALT, ALKPHOS, BILITOT in the last 168 hours. Hematology Recent Labs  Lab 06/24/17 0926  WBC 8.8  RBC 5.13  HGB 16.0  HCT 49.0  MCV 95.5  MCH 31.2  MCHC 32.7  RDW 14.0  PLT 257   Cardiac EnzymesNo results for input(s): TROPONINI in the last 168 hours.  Recent Labs  Lab 06/24/17 0933  TROPIPOC 0.01    BNPNo results for input(s): BNP, PROBNP in the last 168 hours.  DDimer No results for input(s): DDIMER in the last 168 hours.  Radiology/Studies:  Dg Chest 2 View  Result Date: 06/24/2017 CLINICAL DATA:  Palpitations. EXAM: CHEST - 2 VIEW COMPARISON:  07/13/2015 FINDINGS: Normal heart size. Aortic atherosclerosis identified. No pleural effusion or edema. No airspace opacities. Spondylosis noted within the thoracic spine. IMPRESSION:  1. No acute findings. 2.  Aortic Atherosclerosis (ICD10-I70.0). Electronically Signed   By: Kerby Moors M.D.   On: 06/24/2017 09:40    Assessment and Plan:   1. New onset atrial fib - He is minimally symptomatic. I will start him on Eliquis (CHADSVASC at least 3) and toprol 25 bid (same dose as his wife). I will have him return in a week for an ECG and in 2-3 weeks to see me. I will plan an outpatient 2D echo. As he has no evidence of symptoms of CAD, no plans for screening at this time. 2. HTN - his blood pressure well controlled. If his pressure goes low, then we will stop HCTZ.    For questions or updates, please contact [place] Please consult [email or web address] for contact info under Cardiology/STEMI.   Signed, Cristopher Peru, MD  06/24/2017 12:42 PM

## 2017-06-27 ENCOUNTER — Telehealth: Payer: Self-pay

## 2017-06-27 ENCOUNTER — Ambulatory Visit: Payer: TRICARE For Life (TFL)

## 2017-06-27 DIAGNOSIS — I4891 Unspecified atrial fibrillation: Secondary | ICD-10-CM

## 2017-06-27 NOTE — Telephone Encounter (Signed)
Left message for Pt. Scheduled Pt for nurse visit 07/02/2017 at 2:00 pm for EKG. Scheduled follow up appt with Dr. [name]. Ordered ECHO. Await call back.

## 2017-06-27 NOTE — Telephone Encounter (Signed)
-----   Message from Stanton Kidney, RN sent at 06/26/2017  7:47 AM EDT -----   ----- Message ----- From: Isaiah Serge, NP Sent: 06/24/2017   3:21 PM To: Stanton Kidney, RN  GT saw pt in ER for A fib on the 26th,  Could you make sure pt gets EKG in 1 week and follow up with GT in 2-3 weeks and outpt echo for a fib.  GT may be telling his nurse.

## 2017-06-28 NOTE — Telephone Encounter (Signed)
Pt going out of town.  Will not be here until after June 10.  F/u made with Dr. [name] for June 13.  Will get EKG at that time.

## 2017-07-02 ENCOUNTER — Ambulatory Visit: Payer: TRICARE For Life (TFL)

## 2017-07-10 ENCOUNTER — Ambulatory Visit (HOSPITAL_COMMUNITY): Payer: Medicare Other | Attending: Internal Medicine

## 2017-07-10 ENCOUNTER — Other Ambulatory Visit: Payer: Self-pay

## 2017-07-10 ENCOUNTER — Ambulatory Visit (INDEPENDENT_AMBULATORY_CARE_PROVIDER_SITE_OTHER): Payer: Medicare Other | Admitting: Internal Medicine

## 2017-07-10 ENCOUNTER — Encounter: Payer: Self-pay | Admitting: Internal Medicine

## 2017-07-10 VITALS — BP 132/68 | HR 50 | Ht 71.0 in | Wt 173.0 lb

## 2017-07-10 DIAGNOSIS — E785 Hyperlipidemia, unspecified: Secondary | ICD-10-CM | POA: Insufficient documentation

## 2017-07-10 DIAGNOSIS — I119 Hypertensive heart disease without heart failure: Secondary | ICD-10-CM | POA: Diagnosis not present

## 2017-07-10 DIAGNOSIS — J449 Chronic obstructive pulmonary disease, unspecified: Secondary | ICD-10-CM | POA: Insufficient documentation

## 2017-07-10 DIAGNOSIS — R0602 Shortness of breath: Secondary | ICD-10-CM | POA: Insufficient documentation

## 2017-07-10 DIAGNOSIS — I4891 Unspecified atrial fibrillation: Secondary | ICD-10-CM

## 2017-07-10 NOTE — Patient Instructions (Addendum)

## 2017-07-10 NOTE — Progress Notes (Addendum)
HPI Keith Giles returns today for evaluation of atrial fib. He came in with new onset atrial fib and reverted back to NSR. He feels well. No chest pain or sob. No syncope. No edema. His 2D echo demonstrates preserved LV function with a moderately dilated LA and mildly dilated RV. He feels well and denies chest pain or sob. He would like to get rid of some of his meds. Allergies  Allergen Reactions  . Antihistamines, Diphenhydramine-Type Other (See Comments)    Difficulty  voiding  . Sulfa Antibiotics Rash     Current Outpatient Medications  Medication Sig Dispense Refill  . apixaban (ELIQUIS) 5 MG TABS tablet Take 1 tablet (5 mg total) by mouth 2 (two) times daily. 60 tablet 0  . atorvastatin (LIPITOR) 20 MG tablet Take 20 mg by mouth every evening.     . Eluxadoline (VIBERZI) 100 MG TABS Take by mouth every morning. PT TAKES I/3 OF TABLET    . finasteride (PROSCAR) 5 MG tablet Take 1 tablet by mouth daily.  2  . hydrochlorothiazide (MICROZIDE) 12.5 MG capsule Take 12.5 mg by mouth every morning.     . latanoprost (XALATAN) 0.005 % ophthalmic solution Place 1 drop into both eyes daily.  0  . metoprolol tartrate (LOPRESSOR) 50 MG tablet Take 0.5 tablets (25 mg total) by mouth 2 (two) times daily. 30 tablet 0  . tamsulosin (FLOMAX) 0.4 MG CAPS capsule Take 0.4 mg by mouth daily.     No current facility-administered medications for this visit.      Past Medical History:  Diagnosis Date  . Arthritis   . Bladder stone   . Bladder tumor   . BPH (benign prostatic hyperplasia)   . Chronic asthmatic bronchitis ([place])   . COPD (chronic obstructive pulmonary disease) ([place])   . Diverticulosis of colon   . Full dentures   . History of adenomatous polyp of colon    2002 HYPERPLASTIC  . History of TIA (transient ischemic attack)    PER PT STATES HAD TIA SEVERAL YRS NO RESIDUAL AND DID NOT SEEK MEDICAL ATTENTION  . Hyperlipidemia   . Hypertension   . IBS (irritable bowel syndrome)     . Microhematuria   . Milk intolerance   . Right inguinal hernia   . Shortness of breath   . Urgency of urination   . Wears glasses   . Wears hearing aid    BILATERAL    ROS:   All systems reviewed and negative except as noted in the HPI.   Past Surgical History:  Procedure Laterality Date  . CARDIAC CATHETERIZATION  2000   luminal irregularities LAD, CFX, and RCA  no high grade obstruction  . CARDIOVASCULAR STRESS TEST  02-06-2002   fixed mild thinning of the inferior wall w/ no reversible defects identified, no ischemia/  normal LV function and wall motion, ef 69%  . COLONOSCOPY N/A 02/11/2013   Procedure: COLONOSCOPY;  Surgeon: Garlan Fair, MD;  [facility]: [facility];  Service: Endoscopy;  Laterality: N/A;  . CYSTOSCOPY W/ RETROGRADES Left 06/09/2015   Procedure: CYSTOSCOPY WITH RETROGRADE PYELOGRAM;  Surgeon: Rana Snare, MD;  Location: [facility];  Service: Urology;  Laterality: Left;  . CYSTOSCOPY WITH URETHRAL DILATATION N/A 06/09/2015   Procedure: CYSTOSCOPY WITH URETHRAL DILATATION;  Surgeon: Rana Snare, MD;  Location: [facility];  Service: Urology;  Laterality: N/A;  . HYDROCELE EXCISION Left 01-03-2006  . INGUINAL HERNIA REPAIR Left 1961  .  TONSILLECTOMY    . TRANSURETHRAL RESECTION OF BLADDER TUMOR N/A 06/09/2015   Procedure:  TRANSURETHRAL RESECTION OF BLADDER TUMOR (TURBT);  Surgeon: Rana Snare, MD;  Location: [facility];  Service: Urology;  Laterality: N/A;  . URETEROSCOPY Left 06/09/2015   Procedure: URETEROSCOPY/ STONE EXTRACTION BLADDER;  Surgeon: Rana Snare, MD;  [facility];  Service: Urology;  Laterality: Left;     History reviewed. No pertinent family history.   Social History   Socioeconomic History  . Marital status: Married    Spouse name: Not on file  . Number of children: Not on file  . Years of education: Not on file  . Highest education level: Not on file   Occupational History  . Not on file  Social Needs  . Financial resource strain: Not on file  . Food insecurity:    Worry: Not on file    Inability: Not on file  . Transportation needs:    Medical: Not on file    Non-medical: Not on file  Tobacco Use  . Smoking status: Current Every Day Smoker    Packs/day: 0.25    Years: 60.00    Pack years: 15.00    Types: Cigarettes  . Smokeless tobacco: Never Used  . Tobacco comment: 1 [place] TO 2 WKS  Substance and Sexual Activity  . Alcohol use: Yes    Comment: occasional  . Drug use: No  . Sexual activity: Not on file  Lifestyle  . Physical activity:    Days per week: Not on file    Minutes per session: Not on file  . Stress: Not on file  Relationships  . Social connections:    Talks on phone: Not on file    Gets together: Not on file    Attends religious service: Not on file    Active member of club or organization: Not on file    Attends meetings of clubs or organizations: Not on file    Relationship status: Not on file  . Intimate partner violence:    Fear of current or ex partner: Not on file    Emotionally abused: Not on file    Physically abused: Not on file    Forced sexual activity: Not on file  Other Topics Concern  . Not on file  Social History Narrative  . Not on file     BP 132/68   Pulse (!) 50   Ht 5\' 11"  (1.803 m)   Wt 173 lb (78.5 kg)   BMI 24.13 kg/m   Physical Exam:  Well appearing 84 yo man, NAD HEENT: Unremarkable Neck:  6 cm JVD, no thyromegally Lymphatics:  No adenopathy Back:  No CVA tenderness Lungs:  Clear with no wheezes HEART:  Regular brady rhythm, no murmurs, no rubs, no clicks Abd:  soft, positive bowel sounds, no organomegally, no rebound, no guarding Ext:  2 plus pulses, no edema, no cyanosis, no clubbing Skin:  No rashes no nodules Neuro:  CN II through XII intact, motor grossly intact  EKG - NSR with RBBB  Assess/Plan: 1. PAF - he has reverted back to NSR. He will continue  his systemic anti-coagulation. 2. HTN - his pressure is reasonably well controlled. I have instructed him to continue his metoprolol unless the rate gets too slow. 3. Coags - we will have him continue Eliquis as he is at risk for more atrial fib and stroke.  Mikle Bosworth.D.

## 2017-07-12 ENCOUNTER — Ambulatory Visit: Payer: TRICARE For Life (TFL) | Admitting: Internal Medicine

## 2017-07-16 DIAGNOSIS — R05 Cough: Secondary | ICD-10-CM | POA: Diagnosis not present

## 2017-07-16 DIAGNOSIS — J449 Chronic obstructive pulmonary disease, unspecified: Secondary | ICD-10-CM | POA: Diagnosis not present

## 2017-07-16 DIAGNOSIS — I480 Paroxysmal atrial fibrillation: Secondary | ICD-10-CM | POA: Diagnosis not present

## 2017-07-16 DIAGNOSIS — I10 Essential (primary) hypertension: Secondary | ICD-10-CM | POA: Diagnosis not present

## 2017-07-16 DIAGNOSIS — Z23 Encounter for immunization: Secondary | ICD-10-CM | POA: Diagnosis not present

## 2017-07-26 ENCOUNTER — Other Ambulatory Visit: Payer: Self-pay | Admitting: *Deleted

## 2017-07-26 ENCOUNTER — Telehealth: Payer: Self-pay | Admitting: Internal Medicine

## 2017-07-26 MED ORDER — APIXABAN 5 MG PO TABS: 5.0000 mg | ORAL_TABLET | Freq: Two times a day (BID) | ORAL | 10 refills | Status: DC

## 2017-07-26 MED ORDER — METOPROLOL TARTRATE 50 MG PO TABS: 25.0000 mg | ORAL_TABLET | Freq: Two times a day (BID) | ORAL | 10 refills | Status: DC

## 2017-07-26 NOTE — Telephone Encounter (Signed)
Pt is a 84 yr old male who Dr. [name] on 07/10/17, weight at that visit was 78.5Kg. Last noted SCr was 1.2 on 06/24/17. Will refill Eliquis 5mg  BID.

## 2017-07-26 NOTE — Telephone Encounter (Signed)
New message     *STAT* If patient is at the pharmacy, call can be transferred to refill team.   1. Which medications need to be refilled? (please list name of each medication and dose if known) Eliquis 5 mg, metoprolol 50 mg  2. Which pharmacy/location (including street and city if local pharmacy) is medication to be sent to? [place] and [place] they need a 30 day or 90 day supply? 30 day

## 2017-08-14 DIAGNOSIS — J449 Chronic obstructive pulmonary disease, unspecified: Secondary | ICD-10-CM | POA: Diagnosis not present

## 2017-08-22 ENCOUNTER — Other Ambulatory Visit: Payer: Self-pay | Admitting: Internal Medicine

## 2017-08-22 ENCOUNTER — Other Ambulatory Visit: Payer: Self-pay | Admitting: *Deleted

## 2017-08-22 MED ORDER — APIXABAN 5 MG PO TABS: 5.0000 mg | ORAL_TABLET | Freq: Two times a day (BID) | ORAL | 2 refills | Status: DC

## 2017-08-22 MED ORDER — METOPROLOL TARTRATE 50 MG PO TABS: 25.0000 mg | ORAL_TABLET | Freq: Two times a day (BID) | ORAL | 3 refills | Status: DC

## 2017-09-03 DIAGNOSIS — M25511 Pain in right shoulder: Secondary | ICD-10-CM | POA: Diagnosis not present

## 2017-09-03 DIAGNOSIS — N529 Male erectile dysfunction, unspecified: Secondary | ICD-10-CM | POA: Diagnosis not present

## 2017-09-03 DIAGNOSIS — G8929 Other chronic pain: Secondary | ICD-10-CM | POA: Diagnosis not present

## 2017-09-06 DIAGNOSIS — M19111 Post-traumatic osteoarthritis, right shoulder: Secondary | ICD-10-CM | POA: Diagnosis not present

## 2017-09-06 DIAGNOSIS — M25511 Pain in right shoulder: Secondary | ICD-10-CM | POA: Diagnosis not present

## 2017-10-15 DIAGNOSIS — D1800 Hemangioma unspecified site: Secondary | ICD-10-CM | POA: Diagnosis not present

## 2017-10-15 DIAGNOSIS — L219 Seborrheic dermatitis, unspecified: Secondary | ICD-10-CM | POA: Diagnosis not present

## 2017-10-15 DIAGNOSIS — L812 Freckles: Secondary | ICD-10-CM | POA: Diagnosis not present

## 2017-10-15 DIAGNOSIS — L300 Nummular dermatitis: Secondary | ICD-10-CM | POA: Diagnosis not present

## 2017-10-15 DIAGNOSIS — D692 Other nonthrombocytopenic purpura: Secondary | ICD-10-CM | POA: Diagnosis not present

## 2017-10-15 DIAGNOSIS — Z1283 Encounter for screening for malignant neoplasm of skin: Secondary | ICD-10-CM | POA: Diagnosis not present

## 2017-10-15 DIAGNOSIS — L821 Other seborrheic keratosis: Secondary | ICD-10-CM | POA: Diagnosis not present

## 2017-10-15 DIAGNOSIS — L578 Other skin changes due to chronic exposure to nonionizing radiation: Secondary | ICD-10-CM | POA: Diagnosis not present

## 2017-10-15 DIAGNOSIS — D485 Neoplasm of uncertain behavior of skin: Secondary | ICD-10-CM | POA: Diagnosis not present

## 2017-10-15 DIAGNOSIS — D229 Melanocytic nevi, unspecified: Secondary | ICD-10-CM | POA: Diagnosis not present

## 2017-10-16 DIAGNOSIS — H401123 Primary open-angle glaucoma, left eye, severe stage: Secondary | ICD-10-CM | POA: Diagnosis not present

## 2017-11-28 DIAGNOSIS — Z23 Encounter for immunization: Secondary | ICD-10-CM | POA: Diagnosis not present

## 2018-02-07 DIAGNOSIS — J449 Chronic obstructive pulmonary disease, unspecified: Secondary | ICD-10-CM | POA: Diagnosis not present

## 2018-02-07 DIAGNOSIS — I480 Paroxysmal atrial fibrillation: Secondary | ICD-10-CM | POA: Diagnosis not present

## 2018-02-07 DIAGNOSIS — I700 Atherosclerosis of aorta: Secondary | ICD-10-CM | POA: Diagnosis not present

## 2018-02-07 DIAGNOSIS — K589 Irritable bowel syndrome without diarrhea: Secondary | ICD-10-CM | POA: Diagnosis not present

## 2018-02-07 DIAGNOSIS — I10 Essential (primary) hypertension: Secondary | ICD-10-CM | POA: Diagnosis not present

## 2018-02-07 DIAGNOSIS — E7800 Pure hypercholesterolemia, unspecified: Secondary | ICD-10-CM | POA: Diagnosis not present

## 2018-02-07 DIAGNOSIS — N401 Enlarged prostate with lower urinary tract symptoms: Secondary | ICD-10-CM | POA: Diagnosis not present

## 2018-02-07 DIAGNOSIS — Z1389 Encounter for screening for other disorder: Secondary | ICD-10-CM | POA: Diagnosis not present

## 2018-02-07 DIAGNOSIS — Z0000 Encounter for general adult medical examination without abnormal findings: Secondary | ICD-10-CM | POA: Diagnosis not present

## 2018-02-19 DIAGNOSIS — H25012 Cortical age-related cataract, left eye: Secondary | ICD-10-CM | POA: Diagnosis not present

## 2018-02-19 DIAGNOSIS — H401123 Primary open-angle glaucoma, left eye, severe stage: Secondary | ICD-10-CM | POA: Diagnosis not present

## 2018-02-19 DIAGNOSIS — H26491 Other secondary cataract, right eye: Secondary | ICD-10-CM | POA: Diagnosis not present

## 2018-02-19 DIAGNOSIS — H52203 Unspecified astigmatism, bilateral: Secondary | ICD-10-CM | POA: Diagnosis not present

## 2018-03-18 DIAGNOSIS — C674 Malignant neoplasm of posterior wall of bladder: Secondary | ICD-10-CM | POA: Diagnosis not present

## 2018-03-18 DIAGNOSIS — R3912 Poor urinary stream: Secondary | ICD-10-CM | POA: Diagnosis not present

## 2018-03-18 DIAGNOSIS — N5201 Erectile dysfunction due to arterial insufficiency: Secondary | ICD-10-CM | POA: Diagnosis not present

## 2018-03-19 DIAGNOSIS — L300 Nummular dermatitis: Secondary | ICD-10-CM | POA: Diagnosis not present

## 2018-03-19 DIAGNOSIS — L299 Pruritus, unspecified: Secondary | ICD-10-CM | POA: Diagnosis not present

## 2018-04-03 DIAGNOSIS — M25512 Pain in left shoulder: Secondary | ICD-10-CM | POA: Diagnosis not present

## 2018-04-03 DIAGNOSIS — M25522 Pain in left elbow: Secondary | ICD-10-CM | POA: Diagnosis not present

## 2018-04-03 DIAGNOSIS — M542 Cervicalgia: Secondary | ICD-10-CM | POA: Diagnosis not present

## 2018-04-09 DIAGNOSIS — M79602 Pain in left arm: Secondary | ICD-10-CM | POA: Diagnosis not present

## 2018-04-10 DIAGNOSIS — K589 Irritable bowel syndrome without diarrhea: Secondary | ICD-10-CM | POA: Diagnosis not present

## 2018-04-12 ENCOUNTER — Ambulatory Visit: Payer: Medicare Other | Attending: Orthopedic Surgery

## 2018-04-12 ENCOUNTER — Other Ambulatory Visit: Payer: Self-pay

## 2018-04-12 DIAGNOSIS — M25522 Pain in left elbow: Secondary | ICD-10-CM | POA: Insufficient documentation

## 2018-04-12 DIAGNOSIS — M5413 Radiculopathy, cervicothoracic region: Secondary | ICD-10-CM | POA: Diagnosis not present

## 2018-04-12 NOTE — Therapy (Signed)
[place] MAIN [facility] SERVICES [address] [place], [place], [address] Phone: [phone]   Fax:  [phone]  Physical Therapy Evaluation  Patient Details  Name: Keith Giles MRN: [id number] Date of Birth: 26-Aug-1933 Referring Provider (PT): Jenetta Loges, PA-C  [place])   Encounter Date: 04/12/2018  PT End of Session - 04/12/18 1510    Visit Number  1    Number of Visits  8    Date for PT Re-Evaluation  05/13/18    Authorization Type  MCR // [employer]     Authorization Time Period  04/12/18-05/13/18    PT Start Time  1310   Pt late 2/2 paperwork   PT Stop Time  0086    PT Time Calculation (min)  48 min    Activity Tolerance  Patient tolerated treatment well;No increased pain    Behavior During Therapy  WFL for tasks assessed/performed       Past Medical History:  Diagnosis Date  . Arthritis   . Bladder stone   . Bladder tumor   . BPH (benign prostatic hyperplasia)   . Chronic asthmatic bronchitis ([place])   . COPD (chronic obstructive pulmonary disease) ([place])   . Diverticulosis of colon   . Full dentures   . History of adenomatous polyp of colon    2002 HYPERPLASTIC  . History of TIA (transient ischemic attack)    PER PT STATES HAD TIA SEVERAL YRS NO RESIDUAL AND DID NOT SEEK MEDICAL ATTENTION  . Hyperlipidemia   . Hypertension   . IBS (irritable bowel syndrome)   . Microhematuria   . Milk intolerance   . Right inguinal hernia   . Shortness of breath   . Urgency of urination   . Wears glasses   . Wears hearing aid    BILATERAL    Past Surgical History:  Procedure Laterality Date  . CARDIAC CATHETERIZATION  2000   luminal irregularities LAD, CFX, and RCA  no high grade obstruction  . CARDIOVASCULAR STRESS TEST  02-06-2002   fixed mild thinning of the inferior wall w/ no reversible defects identified, no ischemia/  normal LV function and wall motion, ef 69%  . COLONOSCOPY N/A 02/11/2013   Procedure: COLONOSCOPY;   Surgeon: Garlan Fair, MD;  [facility];  Service: Endoscopy;  Laterality: N/A;  . CYSTOSCOPY W/ RETROGRADES Left 06/09/2015   Procedure: CYSTOSCOPY WITH RETROGRADE PYELOGRAM;  Surgeon: Rana Snare, MD;  Location: [facility];  Service: Urology;  Laterality: Left;  . CYSTOSCOPY WITH URETHRAL DILATATION N/A 06/09/2015   Procedure: CYSTOSCOPY WITH URETHRAL DILATATION;  Surgeon: Rana Snare, MD;  Location: [facility];  Service: Urology;  Laterality: N/A;  . HYDROCELE EXCISION Left 01-03-2006  . INGUINAL HERNIA REPAIR Left 1961  . TONSILLECTOMY    . TRANSURETHRAL RESECTION OF BLADDER TUMOR N/A 06/09/2015   Procedure:  TRANSURETHRAL RESECTION OF BLADDER TUMOR (TURBT);  Surgeon: Rana Snare, MD;  Location: [facility];  Service: Urology;  Laterality: N/A;  . URETEROSCOPY Left 06/09/2015   Procedure: URETEROSCOPY/ STONE EXTRACTION BLADDER;  Surgeon: Rana Snare, MD;  Location: [facility];  Service: Urology;  Laterality: Left;    There were no vitals filed for this visit.   Subjective Assessment - 04/12/18 1328    Subjective  Pt reports February 23rd, pt was packing meals at church. Lifted a heavy box ( ~40lbs) with Left arm with sudden severe pain in the Left elbow (volar) and within 2  days subsequent numbness in the medial wrist to mid medial brachial region. Cubital pain progressively got worse over the first weak, and he sought medical care, which performed neck imaging revealing of spondylosis widespread. Pt given gabapentin  for elbow pain, which has nearly abated symptoms. Patient denies any erythema or localized swelling to the elbow area. Initially had N/T in Left digits 2-5, and numbness from wrist joint to mid antebrachium. Finger paresthesia has since improved ('almost normal') but area of numbness in the arm has expanded from mid antebrachium to mid brachium.     Pertinent History  Chronic RUE limitations d/t chronic  shoulder problems, recommended for Rt reverse TSA, but decided to forego this procedure recently. 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     How long can you sit comfortably?  ~1 hour when symptoms are bad     How long can you stand comfortably?  painful but not limiting     How long can you walk comfortably?  painful but not limiting     Patient Stated Goals  resolve pain and numbness     Currently in Pain?  No/denies   formerly 9-10/10 before he started taking    Multiple Pain Sites  No         [facility] PT Assessment - 04/12/18 0001      Assessment   Medical Diagnosis  Cervical spondylosis c LUE radiculopathy      Referring Provider (PT)  Jenetta Loges, PA-C    [place]   Onset Date/Surgical Date  03/24/18    Hand Dominance  Right    Next MD Visit  --   04/12/18 for Nerve conduction study   Prior Therapy  None for this problem      Precautions   Precautions  None      Balance Screen   Has the patient fallen in the past 6 months  No    Has the patient had a decrease in activity level because of a fear of falling?   No    Is the patient reluctant to leave their home because of a fear of falling?   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[employer]; [employer] in; clerical work, standinf mostly      Designer, fashion/clothing Comments  *see note for detail      Coordination   Gross Motor Movements are Fluid and Coordinated  Yes    Fine Motor Movements are Fluid and Coordinated  Yes      ROM / Strength   AROM / PROM / Strength  AROM      AROM   AROM Assessment Site  Cervical    Cervical Flexion  76    Cervical Extension  36    Cervical - Right Side Bend  26    Cervical - Left Side Bend  15    Cervical - Right Rotation  55    Cervical -  Left Rotation  59       Light Touch Sensation: -mild reduction in sensation to Left thumb and middle finger -heavy paresthesia in left 5th digit and hypothenar eminence  -absent sensation in medial forearm  -heavy paresthesia in left medial distal  brachium  Strength Testing:  -Grip Strength equal Bilat -key grip strength functional, equal bilat -digital opposition strength mildly limited, but equal bilat  -pollux extension WNL, equal bilat -digital abduction, WNL, equal bilat -digital adduction (mildly limited on Right medial digits)   -Right Elbow flexion/extension 5/5 -Right wrist flexion/extension 5/5  Joint Screening Left Elbow varus/valgus stress testing: negative, painfree Left Elbow flexion/extension ROM: WNL, pain free  Special Tests Spurlings Test: Negative (cervical compression)  Cervical Distraction: Negative Ulnar Nerve Tension Test: Negative   ULTT Median nerve: negative    Objective measurements completed on examination: See above findings.              PT Education - 04/12/18 1504    Education Details  education on cervical dermatomal distribution    Person(s) Educated  Patient    Methods  Explanation;Demonstration    Comprehension  Verbalized understanding;Returned demonstration       PT Short Term Goals - 04/12/18 1531      PT SHORT TERM GOAL #1   Title  After 4 weeks patient will report improved symptoms in LUE and reduced pain without use of Rx medications.     Time  4    Period  Weeks    Status  New    Target Date  05/13/18      PT SHORT TERM GOAL #2   Title  After 4 weeks patient will demonstrate normal sensation in Left hand and brachium above the elbow.     Baseline  mild paresthesia in Left fingers 3 and 5. numbness above the elbow several inches    Time  4    Period  Weeks    Status  New    Target Date  05/13/18                Plan - 04/12/18 1511    Clinical Impression Statement  Keola Heninger is an 85yo male  presenting to OP PT for c/o severe pain in Left cubital fossa and subsequent sensory loss over the distribution of the medial antebrachial cutaneous nerve. At time of examination, patient is pain free, but has persistent numbness as previously described. Examination also reveals very minimal paresthesia to the Lt 3rd and 5th digits. Detail motor screening reveals symmetry at the elbows, wrists, digital abduction, digital adduction, grip, key grip, and pollux. Examination of cervical spine reveals moderate to heavy ROM restrictions in extension, rotation, and lateral flexion, more limited toward the left side. Cervical compression and cervical distraction have no affect on symptoms in exam. Neural limb tension testing is normal for Ulnar and Median nerves. Examination of the cubital fossa reveals some mild boggieness near the Left brachial artery 1-2cm proximal the cubital fossa. Pt will benefit from skilled PT intervention to reduce pain in elbow and to faclitate return to sensation in forearm.     Personal Factors and Comorbidities  Social Background;Fitness;Time since onset of injury/illness/exacerbation    Examination-Activity Limitations  Carry;Sleep;Sit    Examination-Participation Restrictions  Volunteer;Cleaning;Community Activity;Driving;Yard Work    Engineer, manufacturing systems    Rehab Potential  Fair    PT Frequency  2x / week    PT Duration  4 weeks    PT Treatment/Interventions  Therapeutic activities;Therapeutic exercise;Patient/family education;Manual techniques;Dry needling;Moist Heat    PT Next Visit Plan  FU on Nerve Conduction study and results; screening of Left scalenes, pec minor.     PT Home Exercise  Plan  None established     Recommended Other Services  Imaging to determine roll of C8 nerve impingement could be helpful in directing treatment.     Consulted and Agree with Plan of Care  Patient       Patient  will benefit from skilled therapeutic intervention in order to improve the following deficits and impairments:  Impaired sensation, Pain, Decreased range of motion, Impaired UE functional use  Visit Diagnosis: Radiculopathy, cervicothoracic region  Pain in left elbow     Problem List Patient Active Problem List   Diagnosis Date Noted  . Cancer of posterior wall of urinary bladder ([place]) 06/09/2015  . Bladder calculi 06/09/2015  . NICOTINE ADDICTION 09/27/2007  . HYPERLIPIDEMIA 12/18/2006  . HYPERTENSION 12/18/2006  . BRONCHITIS 12/18/2006  . Rudean Hitt D 12/18/2006   3:39 PM, 04/12/18 Etta Grandchild, PT, DPT Physical Therapist - [facility]  Outpatient Physical Therapy- [place] [phone]     Etta Grandchild 04/12/2018, 3:36 PM  [place] MAIN [facility] SERVICES [address] [place], [place], [address] Phone: [phone]   Fax:  [phone]  Name: Keith Giles MRN: [id number] Date of Birth: [birth year]

## 2018-04-13 DIAGNOSIS — G5622 Lesion of ulnar nerve, left upper limb: Secondary | ICD-10-CM | POA: Diagnosis not present

## 2018-04-13 DIAGNOSIS — G5602 Carpal tunnel syndrome, left upper limb: Secondary | ICD-10-CM | POA: Diagnosis not present

## 2018-04-13 DIAGNOSIS — G540 Brachial plexus disorders: Secondary | ICD-10-CM | POA: Diagnosis not present

## 2018-04-15 ENCOUNTER — Ambulatory Visit: Payer: Medicare Other

## 2018-04-15 ENCOUNTER — Other Ambulatory Visit: Payer: Self-pay

## 2018-04-15 DIAGNOSIS — M5413 Radiculopathy, cervicothoracic region: Secondary | ICD-10-CM

## 2018-04-15 DIAGNOSIS — M25522 Pain in left elbow: Secondary | ICD-10-CM

## 2018-04-15 NOTE — Therapy (Signed)
[place] MAIN [facility] SERVICES [address] [place], [place], [address] Phone: [phone]   Fax:  [phone]  Physical Therapy Treatment  Patient Details  Name: Keith Giles MRN: [id number] Date of Birth: 04/09/1933 Referring Provider (PT): Jenetta Loges, PA-C  [place])   Encounter Date: 04/15/2018  PT End of Session - 04/15/18 0856    Visit Number  2    Number of Visits  8    Date for PT Re-Evaluation  05/13/18    Authorization Type  MCR // [employer]     Authorization Time Period  04/12/18-05/13/18    PT Start Time  0850    PT Stop Time  0920    PT Time Calculation (min)  30 min    Activity Tolerance  Patient tolerated treatment well;No increased pain    Behavior During Therapy  WFL for tasks assessed/performed       Past Medical History:  Diagnosis Date  . Arthritis   . Bladder stone   . Bladder tumor   . BPH (benign prostatic hyperplasia)   . Chronic asthmatic bronchitis ([place])   . COPD (chronic obstructive pulmonary disease) ([place])   . Diverticulosis of colon   . Full dentures   . History of adenomatous polyp of colon    2002 HYPERPLASTIC  . History of TIA (transient ischemic attack)    PER PT STATES HAD TIA SEVERAL YRS NO RESIDUAL AND DID NOT SEEK MEDICAL ATTENTION  . Hyperlipidemia   . Hypertension   . IBS (irritable bowel syndrome)   . Microhematuria   . Milk intolerance   . Right inguinal hernia   . Shortness of breath   . Urgency of urination   . Wears glasses   . Wears hearing aid    BILATERAL    Past Surgical History:  Procedure Laterality Date  . CARDIAC CATHETERIZATION  2000   luminal irregularities LAD, CFX, and RCA  no high grade obstruction  . CARDIOVASCULAR STRESS TEST  02-06-2002   fixed mild thinning of the inferior wall w/ no reversible defects identified, no ischemia/  normal LV function and wall motion, ef 69%  . COLONOSCOPY N/A 02/11/2013   Procedure: COLONOSCOPY;  Surgeon: Garlan Fair,  MD;  [facility];  Service: Endoscopy;  Laterality: N/A;  . CYSTOSCOPY W/ RETROGRADES Left 06/09/2015   Procedure: CYSTOSCOPY WITH RETROGRADE PYELOGRAM;  Surgeon: Rana Snare, MD;  [facility]: [facility];  Service: Urology;  Laterality: Left;  . CYSTOSCOPY WITH URETHRAL DILATATION N/A 06/09/2015   Procedure: CYSTOSCOPY WITH URETHRAL DILATATION;  Surgeon: Rana Snare, MD;  Location: [facility];  Service: Urology;  Laterality: N/A;  . HYDROCELE EXCISION Left 01-03-2006  . INGUINAL HERNIA REPAIR Left 1961  . TONSILLECTOMY    . TRANSURETHRAL RESECTION OF BLADDER TUMOR N/A 06/09/2015   Procedure:  TRANSURETHRAL RESECTION OF BLADDER TUMOR (TURBT);  Surgeon: Rana Snare, MD;  Location: [facility];  Service: Urology;  Laterality: N/A;  . URETEROSCOPY Left 06/09/2015   Procedure: URETEROSCOPY/ STONE EXTRACTION BLADDER;  Surgeon: Rana Snare, MD;  Location: [facility];  Service: Urology;  Laterality: Left;    There were no vitals filed for this visit.  Subjective Assessment - 04/15/18 0853    Subjective  Pt reports he had nerve conduction velocity test on Saturday which shows irritation of Left antebrachial cutaneous nerve. He ha sbeen needing fewer pain meds, and did not take them for several days.  Pertinent History  Chronic RUE limitations d/t chronic shoulder problems, recommended for Rt reverse TSA, but decided to forego this procedure recently. 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     Currently in Pain?  No/denies       INTERVENTION THIS DATE  Therapeutic Exercise: -Cervical retraction into 2 pillows, supine: 15x5sech -Supine scapular retraction 15x5secH -Supine Shoulder Flexion, Left 0-90 degrees 2lb weight; 2x12  Manual Therapy -MFR to posterior scalenes 5 minutes; -Rt lateral  flexion stretch (P/ROM) in supine 3x60sec -Lt first rib mobilization 3x30sec, Grade III -Pec minor release (posterior portion 1x74minutes, axillary approach) -Pec minor release (posterior portion 1x78minutes, anterior approach) -Left scapular depression/retraction stretch (P/ROM) 2x60sec   PT Short Term Goals - 04/12/18 1531      PT SHORT TERM GOAL #1   Title  After 4 weeks patient will report improved symptoms in LUE and reduced pain without use of Rx medications.     Time  4    Period  Weeks    Status  New    Target Date  05/13/18      PT SHORT TERM GOAL #2   Title  After 4 weeks patient will demonstrate normal sensation in Left hand and brachium above the elbow.     Baseline  mild paresthesia in Left fingers 3 and 5. numbness above the elbow several inches    Time  4    Period  Weeks    Status  New    Target Date  05/13/18               Plan - 04/15/18 0857    Clinical Impression Statement  Pt arrives with improved pain over the weekend, no pain this date. He says numbness is always worse in the morning. Inititated exercise program this date, as well as sott tissue work targeted to structures near the brachial plexus and cutaneous nerves toward the elbow. Pt tolerates session well, no aggravation of pain, alble to complete all activity without irritation.     Rehab Potential  Fair    PT Frequency  2x / week    PT Duration  4 weeks    PT Treatment/Interventions  Therapeutic activities;Therapeutic exercise;Patient/family education;Manual techniques;Dry needling;Moist Heat    PT Next Visit Plan  Continue soft tissue work about the left scalenes, pec minor.     PT Home Exercise Plan  None established     Consulted and Agree with Plan of Care  Patient       Patient will benefit from skilled therapeutic intervention in order to improve the following deficits and impairments:  Impaired sensation, Pain, Decreased range of motion, Impaired UE functional use  Visit  Diagnosis: Radiculopathy, cervicothoracic region  Pain in left elbow     Problem List Patient Active Problem List   Diagnosis Date Noted  . Cancer of posterior wall of urinary bladder ([place]) 06/09/2015  . Bladder calculi 06/09/2015  . NICOTINE ADDICTION 09/27/2007  . HYPERLIPIDEMIA 12/18/2006  . HYPERTENSION 12/18/2006  . BRONCHITIS 12/18/2006  . C O P D 12/18/2006   9:26 AM, 04/15/18 Etta Grandchild, PT, DPT Physical Therapist - [facility]  Outpatient Physical Therapy- [place] [phone]     Katiana Ruland C 04/15/2018, 9:23 AM  [place] MAIN [facility] SERVICES [place], [place],  [place] Phone: [phone]   Fax:  [phone]  Name: Keith Giles MRN: [id number] Date of Birth: [birth year]

## 2018-04-16 DIAGNOSIS — L300 Nummular dermatitis: Secondary | ICD-10-CM | POA: Diagnosis not present

## 2018-04-19 ENCOUNTER — Ambulatory Visit: Payer: Medicare Other

## 2018-04-23 ENCOUNTER — Ambulatory Visit: Payer: Medicare Other

## 2018-04-23 NOTE — Therapy (Signed)
[place] MAIN [facility] SERVICES [address] [place], [place], [address] Phone: [phone]   Fax:  [phone]  Patient Details  Name: Keith Giles MRN: [id number] Date of Birth: December 12, 1933 Referring Provider:  No ref. provider found  Encounter Date: 04/23/2018     DPT called patient to let patient know the status of the clinic and ensure that we will be reaching out to schedule when we re-open. DPT fielded any questions patient had at this time and offered guidance on current home program as necessary. Patient had no further questions at this time, and DPT advised that should questions arise patient can reach out to the clinic for guidance via phone.   Lieutenant Diego PT, DPT [phone] PM,04/23/18 [place] MAIN [facility] SERVICES [address] [place], [place], [address] Phone: [phone]   Fax:  [phone]

## 2018-05-14 DIAGNOSIS — G5602 Carpal tunnel syndrome, left upper limb: Secondary | ICD-10-CM | POA: Diagnosis not present

## 2018-05-14 DIAGNOSIS — G5622 Lesion of ulnar nerve, left upper limb: Secondary | ICD-10-CM | POA: Diagnosis not present

## 2018-05-14 DIAGNOSIS — M79602 Pain in left arm: Secondary | ICD-10-CM | POA: Diagnosis not present

## 2018-05-15 NOTE — Therapy (Signed)
[place] MAIN [facility] SERVICES [address] [place], [place], [address] Phone: [phone]   Fax:  [phone]  Patient Details  Name: Keith Giles MRN: [id number] Date of Birth: 26-Sep-1933 Referring Provider:  No ref. provider found  Encounter Date: 04/23/2018   The [facility] outpatient clinics are closed at this time due to the COVID-19 pandemic. The patient was contacted in regards to his therapy services. He is not interested in telehealth therapy at this time. The patient is in agreement that he is safe and consent to being on hold for therapy services until the [facility] outpatient facilities reopen. At that time the patient will be contacted to schedule an appointment to resume therapy services.      Lyndel Safe Calvina Liptak PT, DPT, GCS  Randy Whitener 05/15/2018, 3:03 PM  [place] MAIN [facility] SERVICES [address] [place], [place], [address] Phone: [phone]   Fax:  [phone]

## 2018-05-20 DIAGNOSIS — K589 Irritable bowel syndrome without diarrhea: Secondary | ICD-10-CM | POA: Diagnosis not present

## 2018-05-20 DIAGNOSIS — K5790 Diverticulosis of intestine, part unspecified, without perforation or abscess without bleeding: Secondary | ICD-10-CM | POA: Diagnosis not present

## 2018-05-21 ENCOUNTER — Ambulatory Visit: Payer: TRICARE For Life (TFL)

## 2018-06-07 ENCOUNTER — Other Ambulatory Visit: Payer: Self-pay | Admitting: Internal Medicine

## 2018-06-07 ENCOUNTER — Telehealth: Payer: Self-pay | Admitting: Internal Medicine

## 2018-06-07 MED ORDER — APIXABAN 5 MG PO TABS: 5.0000 mg | ORAL_TABLET | Freq: Two times a day (BID) | ORAL | 1 refills | Status: DC

## 2018-06-07 MED ORDER — APIXABAN 5 MG PO TABS: 5.0000 mg | ORAL_TABLET | Freq: Two times a day (BID) | ORAL | 0 refills | Status: DC

## 2018-06-07 NOTE — Telephone Encounter (Signed)
New Message   *STAT* If patient is at the pharmacy, call can be transferred to refill team.   1. Which medications need to be refilled? (please list name of each medication and dose if known) Eliquis 5mg   2. Which pharmacy/location (including street and city if local pharmacy) is medication to be sent to? [facility]  3. Do they need a 30 day or 90 day supply? 90 day supply  Please call patient about issue he has a special request.

## 2018-06-07 NOTE — Telephone Encounter (Signed)
Pt was calling stating he needed a supply of Eliqiuis sent to [facility]. It has been done, and his insurance company was charging him over $400.  Pt was advised to contact [facility], since they have already filled it but can't get it to him before he runs out, and they should be able to override it.

## 2018-06-07 NOTE — Telephone Encounter (Signed)
85 yo, 78.5kg, Scr 1.2 on 06/24/17 Last OV 07/10/17 Indication afib

## 2018-06-07 NOTE — Telephone Encounter (Signed)
Returned call to pt, states he is going to run out of Eliquis in 1 week and [facility] told him it would take 2 weeks to get him a refill. Sent 1 month supply to local pharmacy, and 3 month supply to [facility] for subsequent fill.

## 2018-06-07 NOTE — Telephone Encounter (Signed)
F/U Message            Patient is at [facility] needing a emergency supply of "Eliquis"

## 2018-06-25 DIAGNOSIS — H401123 Primary open-angle glaucoma, left eye, severe stage: Secondary | ICD-10-CM | POA: Diagnosis not present

## 2018-06-25 DIAGNOSIS — H26491 Other secondary cataract, right eye: Secondary | ICD-10-CM | POA: Diagnosis not present

## 2018-06-25 DIAGNOSIS — D3131 Benign neoplasm of right choroid: Secondary | ICD-10-CM | POA: Diagnosis not present

## 2018-06-25 DIAGNOSIS — H2512 Age-related nuclear cataract, left eye: Secondary | ICD-10-CM | POA: Diagnosis not present

## 2018-07-17 DIAGNOSIS — G540 Brachial plexus disorders: Secondary | ICD-10-CM | POA: Diagnosis not present

## 2018-07-17 DIAGNOSIS — G5622 Lesion of ulnar nerve, left upper limb: Secondary | ICD-10-CM | POA: Diagnosis not present

## 2018-08-13 DIAGNOSIS — I10 Essential (primary) hypertension: Secondary | ICD-10-CM | POA: Diagnosis not present

## 2018-08-13 DIAGNOSIS — N401 Enlarged prostate with lower urinary tract symptoms: Secondary | ICD-10-CM | POA: Diagnosis not present

## 2018-08-13 DIAGNOSIS — R634 Abnormal weight loss: Secondary | ICD-10-CM | POA: Diagnosis not present

## 2018-08-13 DIAGNOSIS — J42 Unspecified chronic bronchitis: Secondary | ICD-10-CM | POA: Diagnosis not present

## 2018-08-21 ENCOUNTER — Other Ambulatory Visit: Payer: Self-pay | Admitting: Internal Medicine

## 2018-08-21 ENCOUNTER — Ambulatory Visit
Admission: RE | Admit: 2018-08-21 | Discharge: 2018-08-21 | Disposition: A | Discharge status: Home / Self Care | Payer: Medicare Other | Source: Ambulatory Visit | Attending: Internal Medicine | Admitting: Internal Medicine

## 2018-08-21 DIAGNOSIS — J40 Bronchitis, not specified as acute or chronic: Secondary | ICD-10-CM | POA: Diagnosis not present

## 2018-08-21 DIAGNOSIS — R634 Abnormal weight loss: Secondary | ICD-10-CM

## 2018-08-21 DIAGNOSIS — F17200 Nicotine dependence, unspecified, uncomplicated: Secondary | ICD-10-CM | POA: Diagnosis not present

## 2018-08-29 ENCOUNTER — Telehealth: Payer: Self-pay

## 2018-08-29 NOTE — Telephone Encounter (Signed)
NOTES ON FILE FROM DR Jenny Reichmann GRIFFIN [phone] REFERRAL SENT TO [place]

## 2018-09-11 ENCOUNTER — Ambulatory Visit (INDEPENDENT_AMBULATORY_CARE_PROVIDER_SITE_OTHER): Payer: Medicare Other | Admitting: Student

## 2018-09-11 ENCOUNTER — Other Ambulatory Visit: Payer: Self-pay

## 2018-09-11 VITALS — BP 108/60 | HR 58 | Ht 71.5 in | Wt 162.0 lb

## 2018-09-11 DIAGNOSIS — I10 Essential (primary) hypertension: Secondary | ICD-10-CM

## 2018-09-11 DIAGNOSIS — I480 Paroxysmal atrial fibrillation: Secondary | ICD-10-CM

## 2018-09-11 NOTE — Progress Notes (Signed)
PCP:  Lavone Orn, MD Primary Cardiologist: No primary care provider on file. Electrophysiologist: Cristopher Peru, MD   Keith Giles is a 85 y.o. male who presents today for routine electrophysiology followup. They are seen for Dr [name].   Since last being seen in our clinic, the patient reports doing very well.  He denies denies symptoms of palpitations, chest pain, shortness of breath, orthopnea, PND, lower extremity edema, claudication, dizziness, presyncope, syncope, bleeding, or neurologic sequela. The patient is tolerating medications without difficulties.    The patient feels that he is tolerating medications without difficulties and is otherwise without complaint today.   Past Medical History:  Diagnosis Date  . Arthritis   . Bladder stone   . Bladder tumor   . BPH (benign prostatic hyperplasia)   . Chronic asthmatic bronchitis ([place])   . COPD (chronic obstructive pulmonary disease) ([place])   . Diverticulosis of colon   . Full dentures   . History of adenomatous polyp of colon    2002 HYPERPLASTIC  . History of TIA (transient ischemic attack)    PER PT STATES HAD TIA SEVERAL YRS NO RESIDUAL AND DID NOT SEEK MEDICAL ATTENTION  . Hyperlipidemia   . Hypertension   . IBS (irritable bowel syndrome)   . Microhematuria   . Milk intolerance   . Right inguinal hernia   . Shortness of breath   . Urgency of urination   . Wears glasses   . Wears hearing aid    BILATERAL   Past Surgical History:  Procedure Laterality Date  . CARDIAC CATHETERIZATION  2000   luminal irregularities LAD, CFX, and RCA  no high grade obstruction  . CARDIOVASCULAR STRESS TEST  02-06-2002   fixed mild thinning of the inferior wall w/ no reversible defects identified, no ischemia/  normal LV function and wall motion, ef 69%  . COLONOSCOPY N/A 02/11/2013   Procedure: COLONOSCOPY;  Surgeon: Garlan Fair, MD;  Location: [facility];  Service: Endoscopy;  Laterality: N/A;  . CYSTOSCOPY W/ RETROGRADES  Left 06/09/2015   Procedure: CYSTOSCOPY WITH RETROGRADE PYELOGRAM;  Surgeon: Rana Snare, MD;  Location: [facility];  Service: Urology;  Laterality: Left;  . CYSTOSCOPY WITH URETHRAL DILATATION N/A 06/09/2015   Procedure: CYSTOSCOPY WITH URETHRAL DILATATION;  Surgeon: Rana Snare, MD;  Location: [facility];  Service: Urology;  Laterality: N/A;  . HYDROCELE EXCISION Left 01-03-2006  . INGUINAL HERNIA REPAIR Left 1961  . TONSILLECTOMY    . TRANSURETHRAL RESECTION OF BLADDER TUMOR N/A 06/09/2015   Procedure:  TRANSURETHRAL RESECTION OF BLADDER TUMOR (TURBT);  Surgeon: Rana Snare, MD;  Location: [facility];  Service: Urology;  Laterality: N/A;  . URETEROSCOPY Left 06/09/2015   Procedure: URETEROSCOPY/ STONE EXTRACTION BLADDER;  Surgeon: Rana Snare, MD;  Location: [facility];  Service: Urology;  Laterality: Left;    Current Outpatient Medications  Medication Sig Dispense Refill  . atorvastatin (LIPITOR) 20 MG tablet Take 20 mg by mouth every evening.     Marland Kitchen ELIQUIS 5 MG TABS tablet TAKE 1 TABLET TWICE A DAY 180 tablet 1  . Eluxadoline (VIBERZI) 100 MG TABS Take by mouth every morning. PT TAKES I/3 OF TABLET    . finasteride (PROSCAR) 5 MG tablet Take 1 tablet by mouth daily.  2  . hydrochlorothiazide (MICROZIDE) 12.5 MG capsule Take 12.5 mg by mouth every morning.     . latanoprost (XALATAN) 0.005 % ophthalmic solution Place 1 drop into both eyes daily.  0  . metoprolol tartrate (LOPRESSOR) 50 MG tablet Take 0.5 tablets (25 mg total) by mouth 2 (two) times daily. 90 tablet 3  . tamsulosin (FLOMAX) 0.4 MG CAPS capsule Take 0.4 mg by mouth daily.     No current facility-administered medications for this visit.     Allergies  Allergen Reactions  . Antihistamines, Diphenhydramine-Type Other (See Comments)    Difficulty  voiding  . Sulfa Antibiotics Rash    Social History   Socioeconomic History  . Marital status: Married     Spouse name: Not on file  . Number of children: Not on file  . Years of education: Not on file  . Highest education level: Not on file  Occupational History  . Not on file  Social Needs  . Financial resource strain: Not on file  . Food insecurity    Worry: Not on file    Inability: Not on file  . Transportation needs    Medical: Not on file    Non-medical: Not on file  Tobacco Use  . Smoking status: Current Every Day Smoker    Packs/day: 0.25    Years: 60.00    Pack years: 15.00    Types: Cigarettes  . Smokeless tobacco: Never Used  . Tobacco comment: 1 [place] TO 2 WKS  Substance and Sexual Activity  . Alcohol use: Yes    Comment: occasional  . Drug use: No  . Sexual activity: Not on file  Lifestyle  . Physical activity    Days per week: Not on file    Minutes per session: Not on file  . Stress: Not on file  Relationships  . Social Herbalist on phone: Not on file    Gets together: Not on file    Attends religious service: Not on file    Active member of club or organization: Not on file    Attends meetings of clubs or organizations: Not on file    Relationship status: Not on file  . Intimate partner violence    Fear of current or ex partner: Not on file    Emotionally abused: Not on file    Physically abused: Not on file    Forced sexual activity: Not on file  Other Topics Concern  . Not on file  Social History Narrative  . Not on file     Review of Systems: General: No chills, fever, night sweats or weight changes  Cardiovascular:  No chest pain, dyspnea on exertion, edema, orthopnea, palpitations, paroxysmal nocturnal dyspnea Dermatological: No rash, lesions or masses Respiratory: No cough, dyspnea Urologic: No hematuria, dysuria Abdominal: No nausea, vomiting, diarrhea, bright red blood per rectum, melena, or hematemesis Neurologic: No visual changes, weakness, changes in mental status All other systems reviewed and are otherwise negative except  as noted above.  Physical Exam: Vitals:   09/11/18 1039  BP: 108/60  Pulse: (!) 58  Weight: 162 lb (73.5 kg)  Height: 5' 11.5" (1.816 m)    GEN- The patient is well appearing, alert and oriented x 3 today.   HEENT: normocephalic, atraumatic; sclera clear, conjunctiva pink; hearing intact; oropharynx clear; neck supple, no JVP Lymph- no cervical lymphadenopathy Lungs- Clear to ausculation bilaterally, normal work of breathing.  No wheezes, rales, rhonchi Heart- Regular rate and rhythm, no murmurs, rubs or gallops, PMI not laterally displaced GI- soft, non-tender, non-distended, bowel sounds present, no hepatosplenomegaly Extremities- no clubbing, cyanosis, or edema; DP/PT/radial pulses 2+ bilaterally MS- no significant deformity or  atrophy Skin- warm and dry, no rash or lesion Psych- euthymic mood, full affect Neuro- strength and sensation are intact  EKG - Sinus bradycardia 58 bpm with 1st degree AV block PR 222.  Assessment and Plan: 1. Paroxysmal Afib  No recurrence that he is aware of CHA2DS2VASC is at least 3; Tolerating Eliquis  2. HTN Well controlled to soft on current medications. He can cut back Lopressor further if needed, especially if HRs slow.    Shirley Friar, PA-C  09/11/18 10:41 AM

## 2018-10-14 ENCOUNTER — Other Ambulatory Visit: Payer: Self-pay | Admitting: Internal Medicine

## 2018-10-23 DIAGNOSIS — H401123 Primary open-angle glaucoma, left eye, severe stage: Secondary | ICD-10-CM | POA: Diagnosis not present

## 2018-10-28 DIAGNOSIS — D225 Melanocytic nevi of trunk: Secondary | ICD-10-CM | POA: Diagnosis not present

## 2018-10-28 DIAGNOSIS — L821 Other seborrheic keratosis: Secondary | ICD-10-CM | POA: Diagnosis not present

## 2018-10-28 DIAGNOSIS — D485 Neoplasm of uncertain behavior of skin: Secondary | ICD-10-CM | POA: Diagnosis not present

## 2018-10-28 DIAGNOSIS — L700 Acne vulgaris: Secondary | ICD-10-CM | POA: Diagnosis not present

## 2018-10-28 DIAGNOSIS — L300 Nummular dermatitis: Secondary | ICD-10-CM | POA: Diagnosis not present

## 2018-10-28 DIAGNOSIS — Z1283 Encounter for screening for malignant neoplasm of skin: Secondary | ICD-10-CM | POA: Diagnosis not present

## 2018-10-28 DIAGNOSIS — D1800 Hemangioma unspecified site: Secondary | ICD-10-CM | POA: Diagnosis not present

## 2018-10-28 DIAGNOSIS — L820 Inflamed seborrheic keratosis: Secondary | ICD-10-CM | POA: Diagnosis not present

## 2018-10-28 DIAGNOSIS — D2239 Melanocytic nevi of other parts of face: Secondary | ICD-10-CM | POA: Diagnosis not present

## 2018-10-28 DIAGNOSIS — L720 Epidermal cyst: Secondary | ICD-10-CM | POA: Diagnosis not present

## 2018-10-28 DIAGNOSIS — D224 Melanocytic nevi of scalp and neck: Secondary | ICD-10-CM | POA: Diagnosis not present

## 2018-11-06 DIAGNOSIS — Z23 Encounter for immunization: Secondary | ICD-10-CM | POA: Diagnosis not present

## 2018-12-03 DIAGNOSIS — J42 Unspecified chronic bronchitis: Secondary | ICD-10-CM | POA: Diagnosis not present

## 2018-12-24 DIAGNOSIS — E7800 Pure hypercholesterolemia, unspecified: Secondary | ICD-10-CM | POA: Diagnosis not present

## 2018-12-24 DIAGNOSIS — I10 Essential (primary) hypertension: Secondary | ICD-10-CM | POA: Diagnosis not present

## 2018-12-24 DIAGNOSIS — J42 Unspecified chronic bronchitis: Secondary | ICD-10-CM | POA: Diagnosis not present

## 2018-12-24 DIAGNOSIS — J449 Chronic obstructive pulmonary disease, unspecified: Secondary | ICD-10-CM | POA: Diagnosis not present

## 2018-12-24 DIAGNOSIS — N401 Enlarged prostate with lower urinary tract symptoms: Secondary | ICD-10-CM | POA: Diagnosis not present

## 2018-12-24 DIAGNOSIS — I480 Paroxysmal atrial fibrillation: Secondary | ICD-10-CM | POA: Diagnosis not present

## 2018-12-24 DIAGNOSIS — M1612 Unilateral primary osteoarthritis, left hip: Secondary | ICD-10-CM | POA: Diagnosis not present

## 2019-02-07 DIAGNOSIS — H401123 Primary open-angle glaucoma, left eye, severe stage: Secondary | ICD-10-CM | POA: Diagnosis not present

## 2019-02-07 DIAGNOSIS — H2512 Age-related nuclear cataract, left eye: Secondary | ICD-10-CM | POA: Diagnosis not present

## 2019-02-07 DIAGNOSIS — H524 Presbyopia: Secondary | ICD-10-CM | POA: Diagnosis not present

## 2019-02-07 DIAGNOSIS — H26491 Other secondary cataract, right eye: Secondary | ICD-10-CM | POA: Diagnosis not present

## 2019-02-14 DIAGNOSIS — F17200 Nicotine dependence, unspecified, uncomplicated: Secondary | ICD-10-CM | POA: Diagnosis not present

## 2019-02-14 DIAGNOSIS — I10 Essential (primary) hypertension: Secondary | ICD-10-CM | POA: Diagnosis not present

## 2019-02-14 DIAGNOSIS — Z23 Encounter for immunization: Secondary | ICD-10-CM | POA: Diagnosis not present

## 2019-02-14 DIAGNOSIS — Z0000 Encounter for general adult medical examination without abnormal findings: Secondary | ICD-10-CM | POA: Diagnosis not present

## 2019-02-14 DIAGNOSIS — Z1389 Encounter for screening for other disorder: Secondary | ICD-10-CM | POA: Diagnosis not present

## 2019-02-14 DIAGNOSIS — I480 Paroxysmal atrial fibrillation: Secondary | ICD-10-CM | POA: Diagnosis not present

## 2019-02-14 DIAGNOSIS — F5101 Primary insomnia: Secondary | ICD-10-CM | POA: Diagnosis not present

## 2019-02-14 DIAGNOSIS — N401 Enlarged prostate with lower urinary tract symptoms: Secondary | ICD-10-CM | POA: Diagnosis not present

## 2019-02-14 DIAGNOSIS — E7800 Pure hypercholesterolemia, unspecified: Secondary | ICD-10-CM | POA: Diagnosis not present

## 2019-02-14 DIAGNOSIS — J449 Chronic obstructive pulmonary disease, unspecified: Secondary | ICD-10-CM | POA: Diagnosis not present

## 2019-02-28 DIAGNOSIS — M7022 Olecranon bursitis, left elbow: Secondary | ICD-10-CM | POA: Diagnosis not present

## 2019-02-28 DIAGNOSIS — J449 Chronic obstructive pulmonary disease, unspecified: Secondary | ICD-10-CM | POA: Diagnosis not present

## 2019-02-28 DIAGNOSIS — I480 Paroxysmal atrial fibrillation: Secondary | ICD-10-CM | POA: Diagnosis not present

## 2019-02-28 DIAGNOSIS — J42 Unspecified chronic bronchitis: Secondary | ICD-10-CM | POA: Diagnosis not present

## 2019-02-28 DIAGNOSIS — I10 Essential (primary) hypertension: Secondary | ICD-10-CM | POA: Diagnosis not present

## 2019-02-28 DIAGNOSIS — E7800 Pure hypercholesterolemia, unspecified: Secondary | ICD-10-CM | POA: Diagnosis not present

## 2019-02-28 DIAGNOSIS — M1612 Unilateral primary osteoarthritis, left hip: Secondary | ICD-10-CM | POA: Diagnosis not present

## 2019-02-28 DIAGNOSIS — N401 Enlarged prostate with lower urinary tract symptoms: Secondary | ICD-10-CM | POA: Diagnosis not present

## 2019-03-06 DIAGNOSIS — H26491 Other secondary cataract, right eye: Secondary | ICD-10-CM | POA: Diagnosis not present

## 2019-03-28 DIAGNOSIS — M19029 Primary osteoarthritis, unspecified elbow: Secondary | ICD-10-CM | POA: Diagnosis not present

## 2019-03-28 DIAGNOSIS — M7022 Olecranon bursitis, left elbow: Secondary | ICD-10-CM | POA: Diagnosis not present

## 2019-04-10 DIAGNOSIS — M19029 Primary osteoarthritis, unspecified elbow: Secondary | ICD-10-CM | POA: Diagnosis not present

## 2019-04-10 DIAGNOSIS — M7022 Olecranon bursitis, left elbow: Secondary | ICD-10-CM | POA: Diagnosis not present

## 2019-04-25 DIAGNOSIS — M7022 Olecranon bursitis, left elbow: Secondary | ICD-10-CM | POA: Diagnosis not present

## 2019-05-13 DIAGNOSIS — M7022 Olecranon bursitis, left elbow: Secondary | ICD-10-CM | POA: Diagnosis not present

## 2019-05-15 ENCOUNTER — Telehealth: Payer: Self-pay | Admitting: *Deleted

## 2019-05-15 NOTE — Telephone Encounter (Signed)
Patient with diagnosis of atrial fibrillation on Eliquis for anticoagulation.    Procedure: elbow olecranon bursectomy Date of procedure: 05/26/19  CHADS2-VASc score of  3 (HTN, AGE x 2)  NOTE:  Chart indicates patient has hx of TIA, however notes that it was several years ago and patient did not seek any medical attention - therefore not counting this in score  CrCl 47.7 Platelet count 257 (from may 2019)  Per office protocol, patient can hold Eliquis for 3 days prior to procedure.    Patient will not need bridging with Lovenox (enoxaparin) around procedure.

## 2019-05-15 NOTE — Telephone Encounter (Signed)
Pharmacy, can you please comment on how long patient can hold Eliquis for elbow olecranon bursectomy?  Thank you!

## 2019-05-15 NOTE — Telephone Encounter (Signed)
   [facility] Pre-operative Risk Assessment    Request for surgical clearance:  1. What type of surgery is being performed? ELBOW OLECRANON BURSECTOMY   2. When is this surgery scheduled? 05/26/19   3. What type of clearance is required (medical clearance vs. Pharmacy clearance to hold med vs. Both)? BOTH  4. Are there any medications that need to be held prior to surgery and how long? ELIQUIS   5. Practice name and name of physician performing surgery? [facility]; DR. Tome   6. What is your office phone number [phone]    7.   What is your office fax number [phone] ATTN: ASHLEY HILTON  8.   Anesthesia type (None, local, MAC, general) ? REGIONAL BLOCK WITH IV SEDATION   Julaine Hua 05/15/2019, 12:45 PM  _________________________________________________________________   (provider comments below)

## 2019-05-16 NOTE — Telephone Encounter (Signed)
   Primary Cardiologist: Dr. Cristopher Peru  Chart reviewed as part of pre-operative protocol coverage. Patient last seen by Oda Kilts, PA-C, in 08/2018 at which time he was doing well from a cardiac standpoint. Patient contact today for further pre-op evaluation and denies any changes since last visit. He notes some shortness of breath with activity due to his COPD but states this is stable. No chest pain, shortness of breath at rest, orthopnea, PND, lower extremity edema, palpitations, lightheadedness, dizziness, or syncope. Able to complete >4.0 METS without any anginal symptoms.   Given past medical history and time since last visit, based on ACC/AHA guidelines, AMBERS THIBERT would be at acceptable risk for the planned procedure without further cardiovascular testing.   Per Pharmacy and office protocol, "Patient can hold Eliquis for 3 days prior to procedure. Patient will not need bridging with Lovenox (enoxaparin) around procedure."  I will route this recommendation to the requesting party via [facility] fax function and remove from pre-op pool.  Please call with questions.  Darreld Mclean, PA-C 05/16/2019, 8:49 AM

## 2019-05-26 ENCOUNTER — Other Ambulatory Visit: Payer: Self-pay

## 2019-05-26 DIAGNOSIS — M7022 Olecranon bursitis, left elbow: Secondary | ICD-10-CM | POA: Diagnosis not present

## 2019-06-05 ENCOUNTER — Other Ambulatory Visit: Payer: Self-pay | Admitting: Internal Medicine

## 2019-06-05 NOTE — Telephone Encounter (Signed)
Prescription refill request for Eliquis received.  Last office visit: 09/11/2018, [place] Scr: 1.22, 08/21/2018 Age: 86 y.o. Weight: 73.5 kg   Prescription refill sent.

## 2019-06-12 DIAGNOSIS — M7022 Olecranon bursitis, left elbow: Secondary | ICD-10-CM | POA: Diagnosis not present

## 2019-06-12 DIAGNOSIS — Z4789 Encounter for other orthopedic aftercare: Secondary | ICD-10-CM | POA: Diagnosis not present

## 2019-06-12 DIAGNOSIS — M19011 Primary osteoarthritis, right shoulder: Secondary | ICD-10-CM | POA: Diagnosis not present

## 2019-06-18 DIAGNOSIS — H2512 Age-related nuclear cataract, left eye: Secondary | ICD-10-CM | POA: Diagnosis not present

## 2019-06-18 DIAGNOSIS — H04123 Dry eye syndrome of bilateral lacrimal glands: Secondary | ICD-10-CM | POA: Diagnosis not present

## 2019-06-18 DIAGNOSIS — H401123 Primary open-angle glaucoma, left eye, severe stage: Secondary | ICD-10-CM | POA: Diagnosis not present

## 2019-07-12 ENCOUNTER — Emergency Department
Admission: EM | Admit: 2019-07-12 | Discharge: 2019-07-12 | Disposition: A | Discharge status: Home / Self Care | Payer: Medicare Other | Attending: Student | Admitting: Student

## 2019-07-12 ENCOUNTER — Emergency Department: Payer: Medicare Other

## 2019-07-12 ENCOUNTER — Other Ambulatory Visit: Payer: Self-pay

## 2019-07-12 DIAGNOSIS — R200 Anesthesia of skin: Secondary | ICD-10-CM | POA: Diagnosis not present

## 2019-07-12 DIAGNOSIS — G5622 Lesion of ulnar nerve, left upper limb: Secondary | ICD-10-CM | POA: Insufficient documentation

## 2019-07-12 DIAGNOSIS — M79602 Pain in left arm: Secondary | ICD-10-CM | POA: Diagnosis present

## 2019-07-12 DIAGNOSIS — I10 Essential (primary) hypertension: Secondary | ICD-10-CM | POA: Diagnosis not present

## 2019-07-12 DIAGNOSIS — Z79899 Other long term (current) drug therapy: Secondary | ICD-10-CM | POA: Insufficient documentation

## 2019-07-12 DIAGNOSIS — Z8673 Personal history of transient ischemic attack (TIA), and cerebral infarction without residual deficits: Secondary | ICD-10-CM | POA: Diagnosis not present

## 2019-07-12 DIAGNOSIS — Z7901 Long term (current) use of anticoagulants: Secondary | ICD-10-CM | POA: Diagnosis not present

## 2019-07-12 DIAGNOSIS — R202 Paresthesia of skin: Secondary | ICD-10-CM | POA: Insufficient documentation

## 2019-07-12 DIAGNOSIS — F17210 Nicotine dependence, cigarettes, uncomplicated: Secondary | ICD-10-CM | POA: Insufficient documentation

## 2019-07-12 DIAGNOSIS — J449 Chronic obstructive pulmonary disease, unspecified: Secondary | ICD-10-CM | POA: Diagnosis not present

## 2019-07-12 LAB — BASIC METABOLIC PANEL
Anion gap: 11 (ref 5–15)
BUN: 26 mg/dL — ABNORMAL HIGH (ref 8–23)
CO2: 23 mmol/L (ref 22–32)
Calcium: 9.5 mg/dL (ref 8.9–10.3)
Chloride: 103 mmol/L (ref 98–111)
Creatinine, Ser: 1.28 mg/dL — ABNORMAL HIGH (ref 0.61–1.24)
GFR calc Af Amer: 60 mL/min — ABNORMAL LOW (ref >60)
GFR calc non Af Amer: 51 mL/min — ABNORMAL LOW (ref >60)
Glucose, Bld: 119 mg/dL — ABNORMAL HIGH (ref 70–99)
Potassium: 4.4 mmol/L (ref 3.5–5.1)
Sodium: 137 mmol/L (ref 135–145)

## 2019-07-12 LAB — CBC
HCT: 44.6 % (ref 39.0–52.0)
Hemoglobin: 15.5 g/dL (ref 13.0–17.0)
MCH: 32.4 pg (ref 26.0–34.0)
MCHC: 34.8 g/dL (ref 30.0–36.0)
MCV: 93.1 fL (ref 80.0–100.0)
Platelets: 263 10*3/uL (ref 150–400)
RBC: 4.79 MIL/uL (ref 4.22–5.81)
RDW: 14.1 % (ref 11.5–15.5)
WBC: 9.9 10*3/uL (ref 4.0–10.5)
nRBC: 0 % (ref 0.0–0.2)

## 2019-07-12 LAB — TROPONIN I (HIGH SENSITIVITY)
Troponin I (High Sensitivity): 3 ng/L (ref <18)
Troponin I (High Sensitivity): 4 ng/L (ref <18)

## 2019-07-12 MED ORDER — OXYCODONE HCL 5 MG PO TABS
5.0000 mg | ORAL_TABLET | Freq: Once | ORAL | Status: AC
  Administered 2019-07-12: 5 mg via ORAL
  Filled 2019-07-12: qty 1

## 2019-07-12 NOTE — ED Notes (Signed)
Pt provided with another warm blanket for comfort by this RN.

## 2019-07-12 NOTE — ED Triage Notes (Signed)
Pt arrives to ER c/o L arm pain from shoulder down. States began 2 days ago. Denies injury. States surgery on elbow 1 month ago.   A&O, in wheelchair.

## 2019-07-12 NOTE — ED Notes (Signed)
Pt states that he had surgery for bursitis several weeks ago. States that today he began to experience pain in the left elbow. Denies injury or fall. Pt has range of motion but complains of pain and holds in 90 degree position.

## 2019-07-12 NOTE — ED Notes (Signed)
First nurse note: pt walks up to desk clutching left arm and claiming that his arm is hurting very badly. Leaning back like he's going to pass out. Taken back for EKG.

## 2019-07-12 NOTE — Discharge Instructions (Signed)
Thank you for letting us take care of you in the emergency department today.  The numbness sensation that you are feeling in your arm seems consistent with an ulnar neuropathy.  Please talk to your orthopedic doctor about this.  Please continue to take any regular, prescribed medications.  For pain you cannot control with ibuprofen or Tylenol, use your stronger pain  medication as directed.  Please follow up with: Your orthopedic doctor - call for a follow up appointment   Please return to the ER for any new or worsening symptoms.

## 2019-07-12 NOTE — ED Notes (Signed)
US to bedside at this time.  

## 2019-07-12 NOTE — ED Notes (Signed)
Repeat VS obtained by this RN. Pt states continued pain to L arm at this time. Pt states he is considering going to [facility] due to wait times in lobby. This RN apologized and explained wait to patient.

## 2019-07-12 NOTE — ED Provider Notes (Signed)
[facility] Emergency Department Provider Note  ____________________________________________   First MD Initiated Contact with Patient 07/12/19 1911     (approximate)  I have reviewed the triage vital signs and the nursing notes.  History  Chief Complaint Arm Pain    HPI Keith Giles is a 86 y.o. male past medical history as below, recently underwent surgery of the left elbow for removal of olecranon bursa several weeks ago, who presents to the emergency department for left arm pain.  Patient states he had an outpatient clinic follow-up appointment with his orthopedist on Thursday.  At that time he had a mild "twinge" of pain in the left arm, but since then it has progressed in severity.  He reports pain along the entire length of his arm, sharp and aching, occasionally spasming, 9/10 in severity.  Starts up at his shoulder blade and includes all the way down the length of his arm.  Reports prior to a few days ago he was healing well from surgery.  No swelling, redness, warmth at the joint.  Denies any new injury or fall.  Pain is improved with certain positions, like when he holds his arm and hangs it from a bar.  He also reports some numbness and tingling down the medial aspect of the forearm, starting at the elbow and radiating down to the pinky and ring finger.  The symptoms just started today.  He denies any associated neck pain.  No chest pain or shortness of breath.   Past Medical Hx Past Medical History:  Diagnosis Date  . Arthritis   . Bladder stone   . Bladder tumor   . BPH (benign prostatic hyperplasia)   . Chronic asthmatic bronchitis ([place])   . COPD (chronic obstructive pulmonary disease) ([place])   . Diverticulosis of colon   . Full dentures   . History of adenomatous polyp of colon    2002 HYPERPLASTIC  . History of TIA (transient ischemic attack)    PER PT STATES HAD TIA SEVERAL YRS NO RESIDUAL AND DID NOT SEEK MEDICAL ATTENTION  .  Hyperlipidemia   . Hypertension   . IBS (irritable bowel syndrome)   . Microhematuria   . Milk intolerance   . Right inguinal hernia   . Shortness of breath   . Urgency of urination   . Wears glasses   . Wears hearing aid    BILATERAL    Problem List Patient Active Problem List   Diagnosis Date Noted  . Cancer of posterior wall of urinary bladder ([place]) 06/09/2015  . Bladder calculi 06/09/2015  . NICOTINE ADDICTION 09/27/2007  . HYPERLIPIDEMIA 12/18/2006  . Essential hypertension 12/18/2006  . BRONCHITIS 12/18/2006  . C O P D 12/18/2006    Past Surgical Hx Past Surgical History:  Procedure Laterality Date  . CARDIAC CATHETERIZATION  2000   luminal irregularities LAD, CFX, and RCA  no high grade obstruction  . CARDIOVASCULAR STRESS TEST  02-06-2002   fixed mild thinning of the inferior wall w/ no reversible defects identified, no ischemia/  normal LV function and wall motion, ef 69%  . COLONOSCOPY N/A 02/11/2013   Procedure: COLONOSCOPY;  Surgeon: Garlan Fair, MD;  Location: [facility];  Service: Endoscopy;  Laterality: N/A;  . CYSTOSCOPY W/ RETROGRADES Left 06/09/2015   Procedure: CYSTOSCOPY WITH RETROGRADE PYELOGRAM;  Surgeon: Rana Snare, MD;  Location: [facility];  Service: Urology;  Laterality: Left;  . CYSTOSCOPY WITH URETHRAL DILATATION N/A 06/09/2015   Procedure:  CYSTOSCOPY WITH URETHRAL DILATATION;  Surgeon: Rana Snare, MD;  Location: [facility];  Service: Urology;  Laterality: N/A;  . HYDROCELE EXCISION Left 01-03-2006  . INGUINAL HERNIA REPAIR Left 1961  . TONSILLECTOMY    . TRANSURETHRAL RESECTION OF BLADDER TUMOR N/A 06/09/2015   Procedure:  TRANSURETHRAL RESECTION OF BLADDER TUMOR (TURBT);  Surgeon: Rana Snare, MD;  Location: [facility];  Service: Urology;  Laterality: N/A;  . URETEROSCOPY Left 06/09/2015   Procedure: URETEROSCOPY/ STONE EXTRACTION BLADDER;  Surgeon: Rana Snare, MD;  Location: [facility];  Service: Urology;  Laterality: Left;    Medications Prior to Admission medications   Medication Sig Start Date End Date Taking? Authorizing Provider  ANORO ELLIPTA 62.5-25 MCG/INH AEPB Inhale 1 puff into the lungs daily. 06/19/19  Yes [provider]  atorvastatin (LIPITOR) 20 MG tablet Take 20 mg by mouth every evening.    Yes [provider]  ELIQUIS 5 MG TABS tablet TAKE 1 TABLET TWICE A DAY 06/05/19  Yes Evans Lance, MD  finasteride (PROSCAR) 5 MG tablet Take 1 tablet by mouth daily. 06/23/17  Yes [provider]  hydrochlorothiazide (MICROZIDE) 12.5 MG capsule Take 12.5 mg by mouth every morning.    Yes [provider]  latanoprost (XALATAN) 0.005 % ophthalmic solution Place 1 drop into both eyes daily. 05/09/17  Yes [provider]  metoprolol tartrate (LOPRESSOR) 50 MG tablet TAKE ONE-HALF (1/2) TABLET TWICE A DAY 10/14/18  Yes Evans Lance, MD  tadalafil (CIALIS) 20 MG tablet  06/05/19  Yes [provider]  tamsulosin (FLOMAX) 0.4 MG CAPS capsule Take 0.4 mg by mouth daily.   Yes [provider]    Allergies Antihistamines, diphenhydramine-type and Sulfa antibiotics  Family Hx No family history on file.  Social Hx Social History   Tobacco Use  . Smoking status: Current Every Day Smoker    Packs/day: 0.25    Years: 60.00    Pack years: 15.00    Types: Cigarettes  . Smokeless tobacco: Never Used  . Tobacco comment: 1 PPWK TO 2 WKS  Substance Use Topics  . Alcohol use: Yes    Comment: occasional  . Drug use: No     Review of Systems  Constitutional: Negative for fever. Negative for chills. Eyes: Negative for visual changes. ENT: Negative for sore throat. Cardiovascular: Negative for chest pain. Respiratory: Negative for shortness of breath. Gastrointestinal: Negative for nausea. Negative for vomiting.  Genitourinary: Negative for dysuria. Musculoskeletal: + left arm pain Skin:  Negative for rash. Neurological: Negative for headaches. + numbness, tingling   Physical Exam  Vital Signs: ED Triage Vitals  Enc Vitals Group     BP 07/12/19 1506 (!) 102/58     Pulse Rate 07/12/19 1506 65     Resp 07/12/19 1506 18     Temp 07/12/19 1509 98 F (36.7 C)     Temp Source 07/12/19 1506 Oral     SpO2 07/12/19 1506 97 %     Weight 07/12/19 1506 155 lb (70.3 kg)     Height 07/12/19 1506 5\' 11"  (1.803 m)     Head Circumference --      Peak Flow --      Pain Score 07/12/19 1505 9     Pain Loc --      Pain Edu? --      Excl. in [place]? --     Constitutional: Alert and oriented. NAD. Constantly readjusting his LUE  to get comfortable.  Head: Normocephalic. Atraumatic. Eyes: Conjunctivae clear. Sclera anicteric. Pupils equal and symmetric. Nose: No masses or lesions. No congestion or rhinorrhea. Mouth/Throat: Wearing mask.  Neck: No stridor. Trachea midline.  Cardiovascular: Normal rate, regular rhythm. Extremities well perfused.  2+ symmetric radial pulses by palpation.  Cap refill less than 3 seconds. Respiratory: Normal respiratory effort.  Lungs CTAB. Gastrointestinal: Soft. Non-distended. Non-tender.  Genitourinary: Deferred. Musculoskeletal: No lower extremity edema. No deformities. LUE: FROM at the shoulder, elbow, wrist.  Surgical incision site at the elbow healing well, no erythema, warmth, effusion, or drainage.  Compartments are soft and compressible and nontender throughout the length of the LUE.  Motor/sensation intact in radial, median, ulnar distribution, but states sensation along the ulnar distribution is slightly decreased compared to others. Neurologic:  Normal speech and language. No gross focal or lateralizing neurologic deficits are appreciated. BUE strength 5/5, symmetric - including biceps, triceps, hand grip.  Skin: Skin is warm, dry and intact. No rash noted. Psychiatric: Mood and affect are appropriate for situation.  EKG  Personally reviewed and  interpreted by myself.   Date: 07/12/19 Time: 1502 Rate: 67 Rhythm: sinus Axis: left Intervals: wide QRS 2/2 RBBB RBBB, PACs in bigeminy - seen previously No STEMI    Radiology  Personally reviewed available imaging myself.   CXR - IMPRESSION:  1. No acute cardiopulmonary disease.  2. Asymmetric increased density in the right lung apex may be  related to lordotic positioning, but non-emergent non-contrast chest  CT is recommended to exclude a mass.   XR L elbow - IMPRESSION:  Degenerative change without acute abnormality.   Ultrasound - IMPRESSION:  No evidence of DVT within the left upper extremity.    Procedures  Procedure(s) performed (including critical care):  Procedures   Initial Impression / Assessment and Plan / MDM / ED Course  86 y.o. male who presents to the ED for LUE pain, and ulnar neuropathy as above.  Recently underwent left elbow olecranon bursectomy.  Ddx: arthritis, postoperative pain, ulnar neuropathy, DVT.  Compartments are soft and compressible on exam, do not suspect compartment syndrome.  No chest pain or shortness of breath, doubt ACS or PE.   Will plan for labs, imaging, ultrasound, pain control  EKG abnormal, but unchanged from prior and no acute ischemic changes. Troponin x 2 negative. CXR w/ incidental finding as above otherwise unremarkable, updated patient and wife on these results and advised PCP follow up of this. Elbow XR negative. [place] negative. Pain improved w/ PO medication here. Given negative work up, feel he is stable for discharge w/ outpatient follow up. He and wife are in agreement. Discussed it is okay to use his home pain medication as directed, if needed for pain he cannot control w/ OTC medications. He voices understanding. Stable for discharge. Given return precautions.    _______________________________   As part of my medical decision making I have reviewed available labs, radiology tests, reviewed old records/performed  chart review.    Final Clinical Impression(s) / ED Diagnosis  Left arm pain    Note:  This document was prepared using Dragon voice recognition software and may include unintentional dictation errors.   Lilia Pro., MD 07/13/19 [phone]

## 2019-07-14 DIAGNOSIS — M79602 Pain in left arm: Secondary | ICD-10-CM | POA: Diagnosis not present

## 2019-07-14 DIAGNOSIS — M5412 Radiculopathy, cervical region: Secondary | ICD-10-CM | POA: Diagnosis not present

## 2019-08-12 DIAGNOSIS — M5412 Radiculopathy, cervical region: Secondary | ICD-10-CM | POA: Diagnosis not present

## 2019-08-14 DIAGNOSIS — M542 Cervicalgia: Secondary | ICD-10-CM | POA: Diagnosis not present

## 2019-08-14 DIAGNOSIS — M79602 Pain in left arm: Secondary | ICD-10-CM | POA: Diagnosis not present

## 2019-08-15 DIAGNOSIS — M5412 Radiculopathy, cervical region: Secondary | ICD-10-CM | POA: Diagnosis not present

## 2019-08-18 DIAGNOSIS — J449 Chronic obstructive pulmonary disease, unspecified: Secondary | ICD-10-CM | POA: Diagnosis not present

## 2019-08-18 DIAGNOSIS — I700 Atherosclerosis of aorta: Secondary | ICD-10-CM | POA: Diagnosis not present

## 2019-08-18 DIAGNOSIS — I10 Essential (primary) hypertension: Secondary | ICD-10-CM | POA: Diagnosis not present

## 2019-08-18 DIAGNOSIS — R200 Anesthesia of skin: Secondary | ICD-10-CM | POA: Diagnosis not present

## 2019-08-25 DIAGNOSIS — C674 Malignant neoplasm of posterior wall of bladder: Secondary | ICD-10-CM | POA: Diagnosis not present

## 2019-08-25 DIAGNOSIS — R319 Hematuria, unspecified: Secondary | ICD-10-CM | POA: Diagnosis not present

## 2019-08-25 DIAGNOSIS — N200 Calculus of kidney: Secondary | ICD-10-CM | POA: Diagnosis not present

## 2019-08-25 DIAGNOSIS — R3912 Poor urinary stream: Secondary | ICD-10-CM | POA: Diagnosis not present

## 2019-09-09 DIAGNOSIS — M542 Cervicalgia: Secondary | ICD-10-CM | POA: Diagnosis not present

## 2019-09-17 DIAGNOSIS — M1612 Unilateral primary osteoarthritis, left hip: Secondary | ICD-10-CM | POA: Diagnosis not present

## 2019-09-17 DIAGNOSIS — E7800 Pure hypercholesterolemia, unspecified: Secondary | ICD-10-CM | POA: Diagnosis not present

## 2019-09-17 DIAGNOSIS — J449 Chronic obstructive pulmonary disease, unspecified: Secondary | ICD-10-CM | POA: Diagnosis not present

## 2019-09-17 DIAGNOSIS — I480 Paroxysmal atrial fibrillation: Secondary | ICD-10-CM | POA: Diagnosis not present

## 2019-09-17 DIAGNOSIS — G5622 Lesion of ulnar nerve, left upper limb: Secondary | ICD-10-CM | POA: Diagnosis not present

## 2019-09-17 DIAGNOSIS — J42 Unspecified chronic bronchitis: Secondary | ICD-10-CM | POA: Diagnosis not present

## 2019-09-17 DIAGNOSIS — I10 Essential (primary) hypertension: Secondary | ICD-10-CM | POA: Diagnosis not present

## 2019-09-17 DIAGNOSIS — N401 Enlarged prostate with lower urinary tract symptoms: Secondary | ICD-10-CM | POA: Diagnosis not present

## 2019-09-17 DIAGNOSIS — G5602 Carpal tunnel syndrome, left upper limb: Secondary | ICD-10-CM | POA: Diagnosis not present

## 2019-09-26 DIAGNOSIS — M7022 Olecranon bursitis, left elbow: Secondary | ICD-10-CM | POA: Diagnosis not present

## 2019-11-03 ENCOUNTER — Other Ambulatory Visit: Payer: Self-pay

## 2019-11-03 ENCOUNTER — Ambulatory Visit (INDEPENDENT_AMBULATORY_CARE_PROVIDER_SITE_OTHER): Payer: Medicare Other | Admitting: Dermatology

## 2019-11-03 DIAGNOSIS — D1800 Hemangioma unspecified site: Secondary | ICD-10-CM

## 2019-11-03 DIAGNOSIS — L739 Follicular disorder, unspecified: Secondary | ICD-10-CM

## 2019-11-03 DIAGNOSIS — L820 Inflamed seborrheic keratosis: Secondary | ICD-10-CM

## 2019-11-03 DIAGNOSIS — L578 Other skin changes due to chronic exposure to nonionizing radiation: Secondary | ICD-10-CM

## 2019-11-03 DIAGNOSIS — D224 Melanocytic nevi of scalp and neck: Secondary | ICD-10-CM | POA: Diagnosis not present

## 2019-11-03 DIAGNOSIS — L720 Epidermal cyst: Secondary | ICD-10-CM | POA: Diagnosis not present

## 2019-11-03 DIAGNOSIS — L300 Nummular dermatitis: Secondary | ICD-10-CM | POA: Diagnosis not present

## 2019-11-03 DIAGNOSIS — D2261 Melanocytic nevi of right upper limb, including shoulder: Secondary | ICD-10-CM

## 2019-11-03 DIAGNOSIS — L821 Other seborrheic keratosis: Secondary | ICD-10-CM

## 2019-11-03 DIAGNOSIS — D229 Melanocytic nevi, unspecified: Secondary | ICD-10-CM | POA: Diagnosis not present

## 2019-11-03 DIAGNOSIS — Z1283 Encounter for screening for malignant neoplasm of skin: Secondary | ICD-10-CM | POA: Diagnosis not present

## 2019-11-03 NOTE — Progress Notes (Signed)
   Follow-Up Visit   Subjective  Keith Giles is a 86 y.o. male who presents for the following: Annual Exam (Upper body skin exam, ) and check spots (L axilla, bil legs, no symptoms).   The following portions of the chart were reviewed this encounter and updated as appropriate:      Review of Systems:  No other skin or systemic complaints except as noted in HPI or Assessment and Plan.  Objective  Well appearing patient in no apparent distress; mood and affect are within normal limits.  All skin waist up examined.  Objective  Left Axilla: Erythematous keratotic or waxy stuck-on papule   Objective  Right Upper Arm: 3.2mm pink white pap  Objective    L post auricular scalp: 1.0cm flesh pap  Left Upper Arm: 2.92mm medium dark brown macule  Objective  Right Lower Back at waistline, L upper neck: 1.0cm SQ nodule R lower back at waistline 0.5cm SQ nodule L upper neck  Objective  bil lower legs: Clear today   Assessment & Plan    Seborrheic Keratoses - Stuck-on, waxy, tan-brown papules and plaques  - Discussed benign etiology and prognosis. - Observe - Call for any changes  Hemangiomas - Red papules - Discussed benign nature - Observe - Call for any changes  Melanocytic Nevi - Tan-brown and/or pink-flesh-colored symmetric macules and papules, -L alar crease, L post ankle - Benign appearing on exam today - Observation - Call clinic for new or changing moles - Recommend daily use of broad spectrum spf 30+ sunscreen to sun-exposed areas.    Actinic Damage - diffuse scaly erythematous macules with underlying dyspigmentation - Recommend daily broad spectrum sunscreen SPF 30+ to sun-exposed areas, reapply every 2 hours as needed.  - Call for new or changing lesions.   Inflamed seborrheic keratosis Left Axilla  Benign, discussed Ln2, pt declines treatment  Discussed otc HC cream it starts to itch  Folliculitis Right Upper Arm  Benign appearing,  observe  Recheck on f/u  Nevus (2) Left Upper Arm; L post auricular scalp  Benign-appearing.  Observation.  Call clinic for new or changing lesions.  Recommend daily use of broad spectrum spf 30+ sunscreen to sun-exposed areas.    Epidermal cyst Right Lower Back at waistline, L upper neck  Reassured benign growth.  Recommend observation.  Discussed surgical excision in office if changes noted or symptomatic.   Nummular dermatitis bil lower legs  Clear today.  Cont TMC 0.1% cr qd/bid prn flares, avoid f/g/a Cont Cetaphil moisturizer qd/bid  Return in about 1 year (around 11/02/2020) for UBSE, recheck Folliculits R upper arm.   I, Othelia Pulling, RMA, am acting as scribe for Brendolyn Patty, MD .  Documentation: I have reviewed the above documentation for accuracy and completeness, and I agree with the above.  Brendolyn Patty MD

## 2019-11-05 DIAGNOSIS — H35373 Puckering of macula, bilateral: Secondary | ICD-10-CM | POA: Diagnosis not present

## 2019-11-05 DIAGNOSIS — H2512 Age-related nuclear cataract, left eye: Secondary | ICD-10-CM | POA: Diagnosis not present

## 2019-11-05 DIAGNOSIS — H401123 Primary open-angle glaucoma, left eye, severe stage: Secondary | ICD-10-CM | POA: Diagnosis not present

## 2019-11-05 DIAGNOSIS — H524 Presbyopia: Secondary | ICD-10-CM | POA: Diagnosis not present

## 2019-11-09 IMAGING — DX DG CHEST 2V
2 series · 2 of 2 positions shown · non-contrast
Comparison: 07/13/2015

CLINICAL DATA: Palpitations.

EXAM:
CHEST - 2 VIEW

[chest pa]
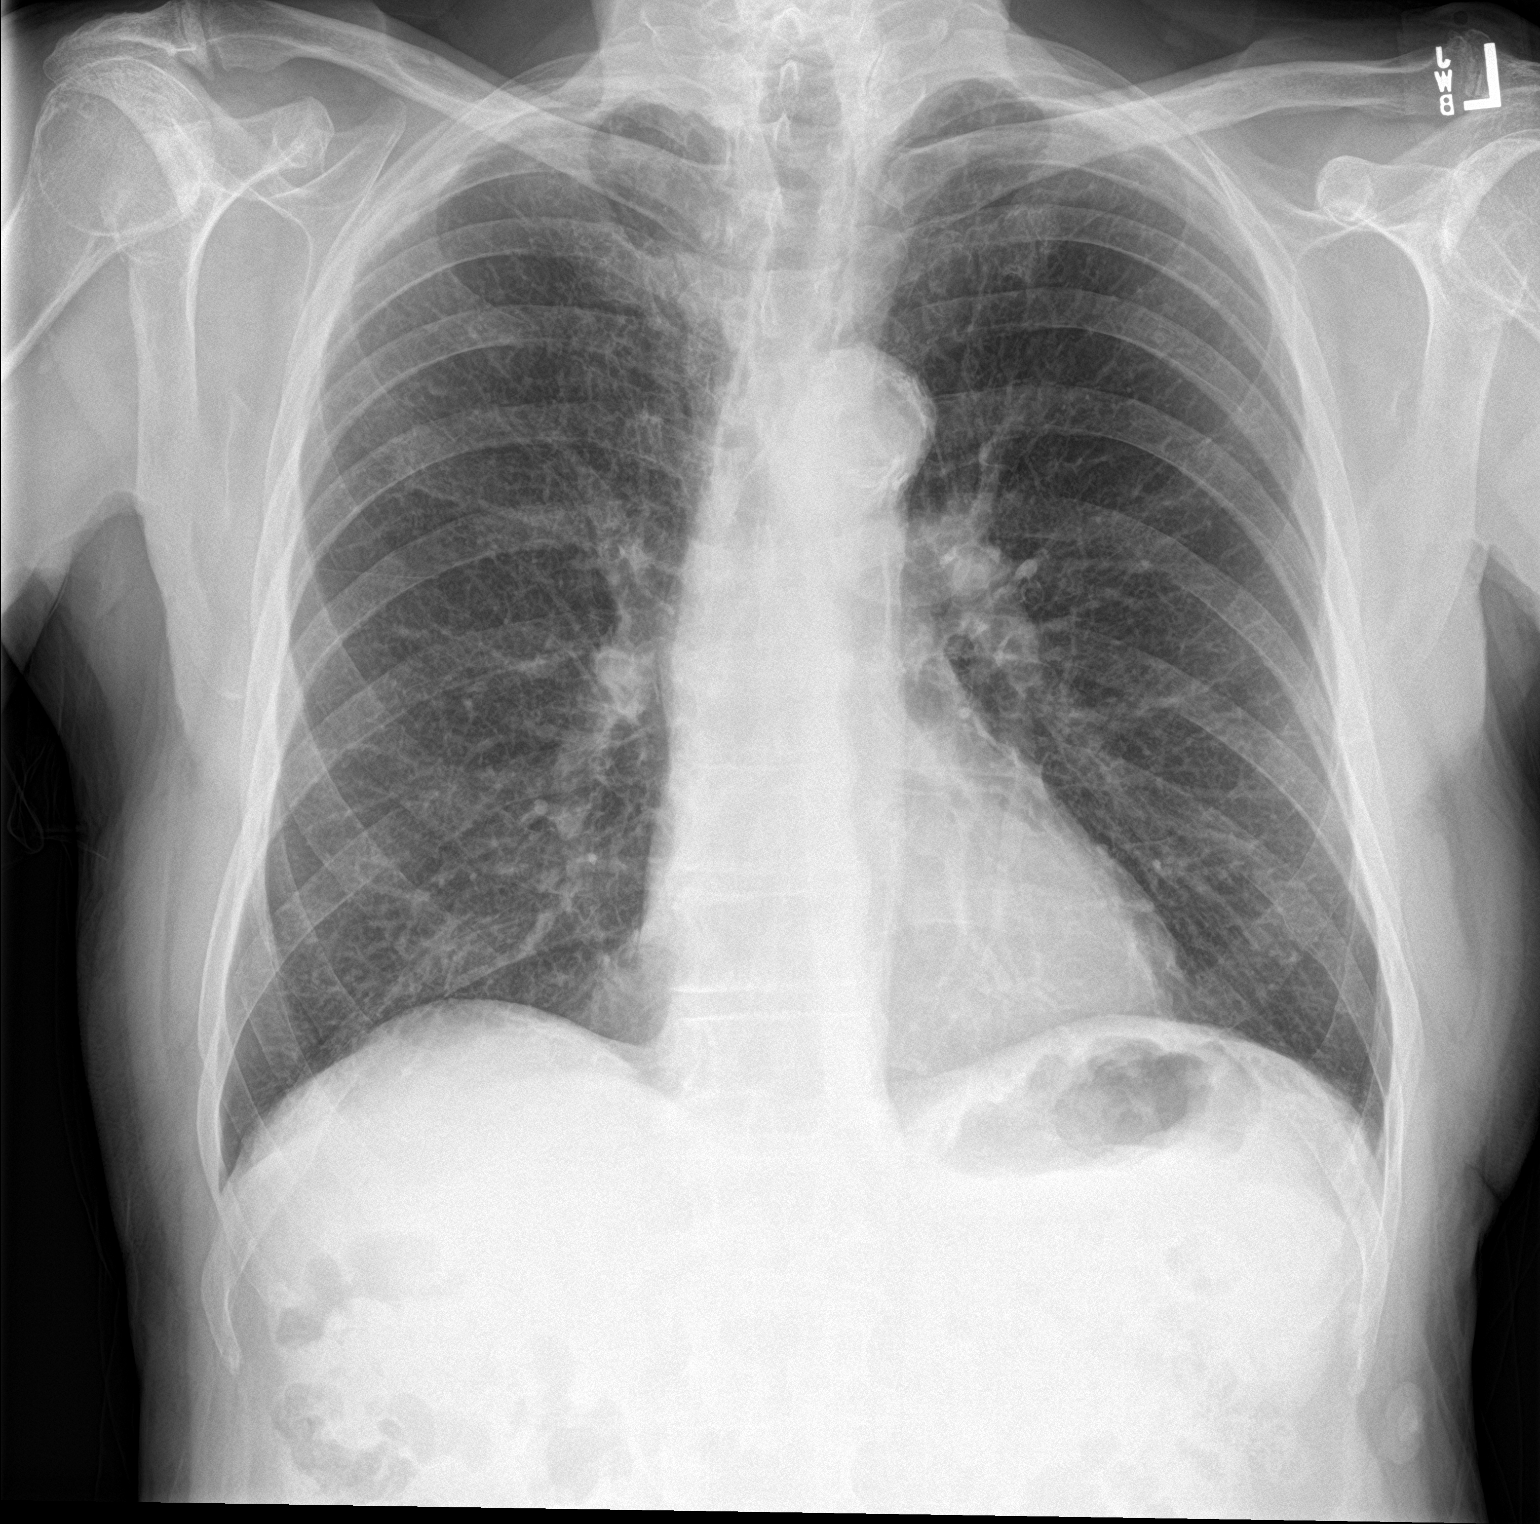

[chest lat]
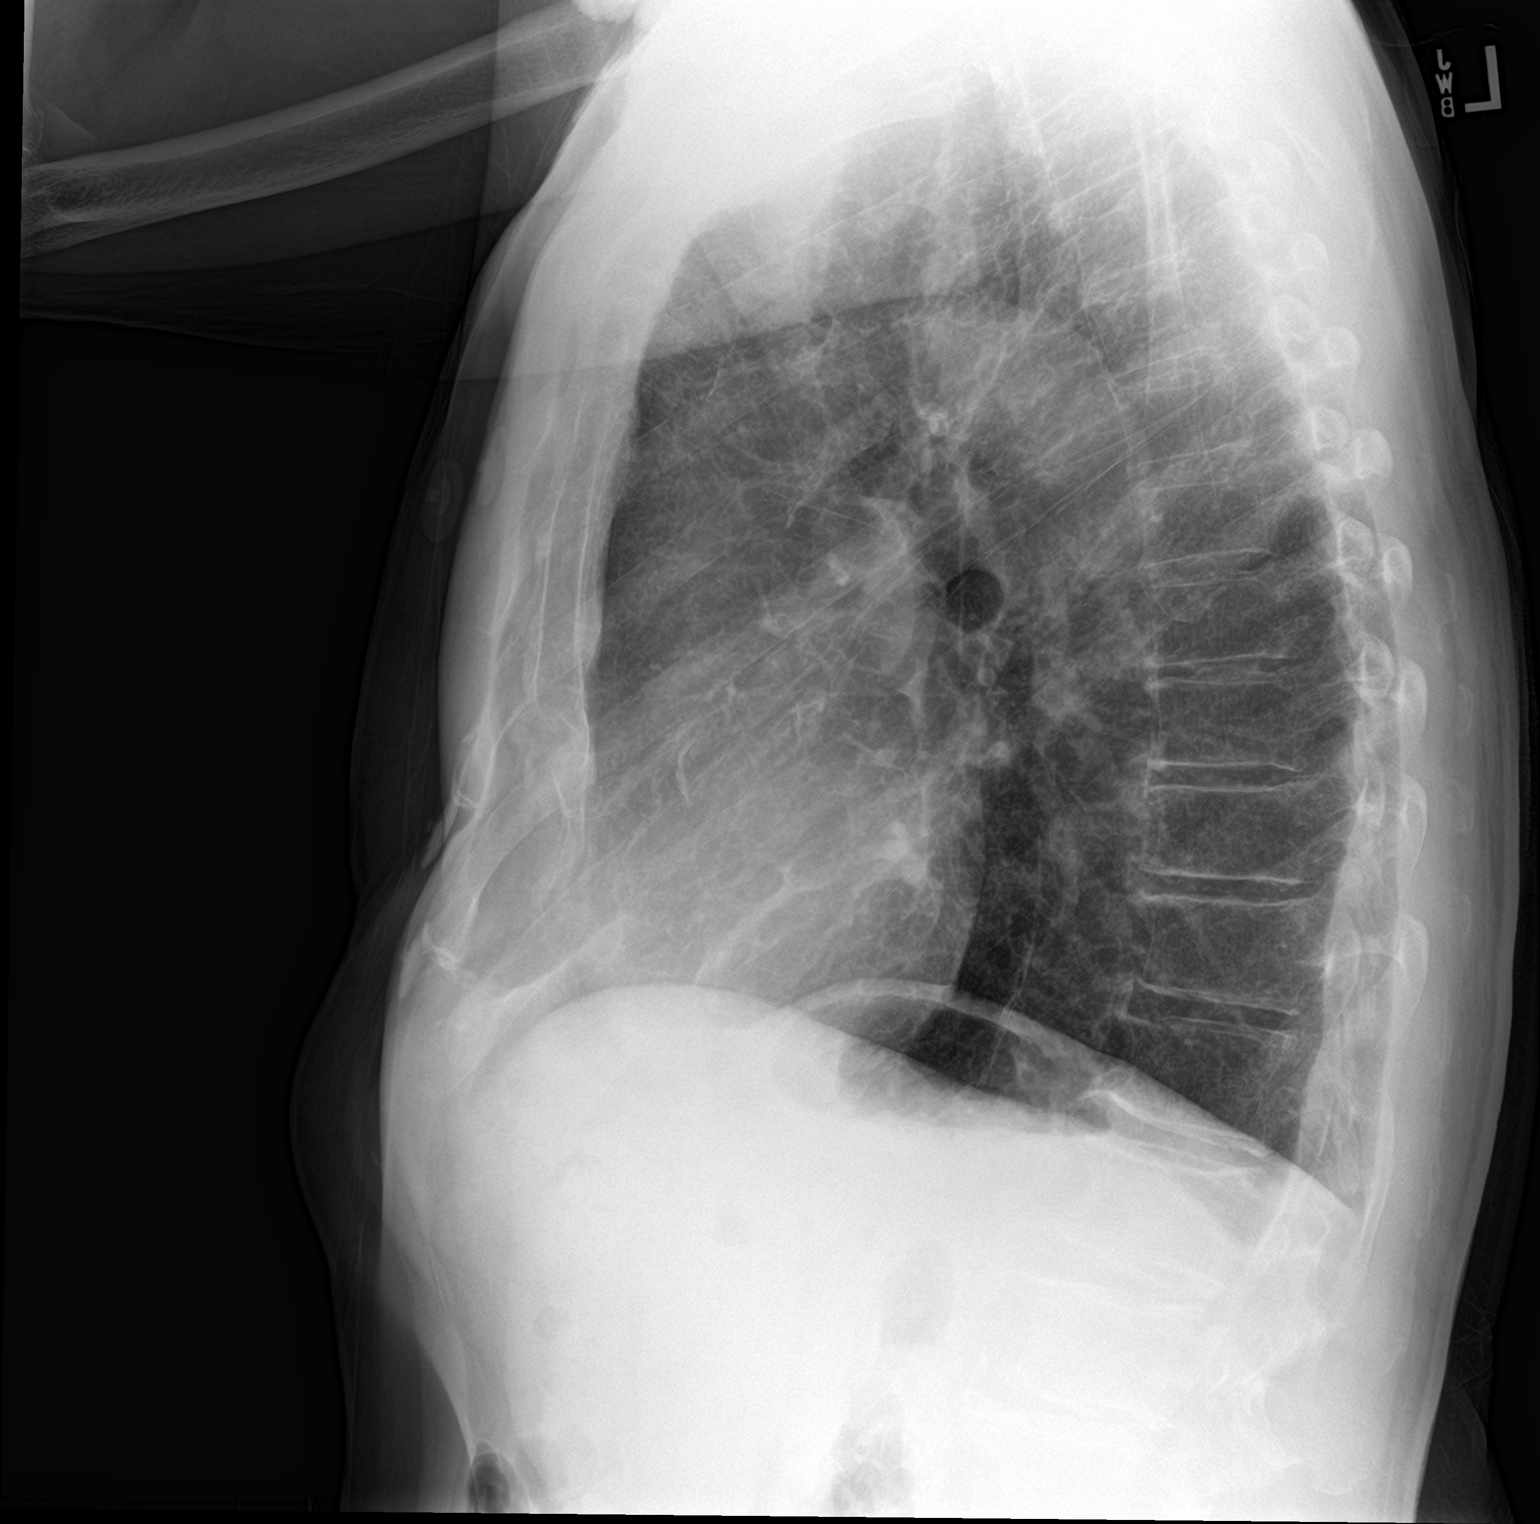

[2 of 2 positions shown; findings below may reference images not displayed]

FINDINGS: Normal heart size. Aortic atherosclerosis identified. No pleural
effusion or edema. No airspace opacities. Spondylosis noted within
the thoracic spine.
IMPRESSION: 1. No acute findings.
2.  Aortic Atherosclerosis ([id number]-N75.5).

## 2019-11-13 DIAGNOSIS — Z23 Encounter for immunization: Secondary | ICD-10-CM | POA: Diagnosis not present

## 2019-12-02 ENCOUNTER — Other Ambulatory Visit: Payer: Self-pay | Admitting: Internal Medicine

## 2019-12-02 NOTE — Telephone Encounter (Signed)
Eliquis 5mg  refill request received. Patient is 86 years old, weight-70.3kg, Crea-1.28 on 07/12/2019, Diagnosis-Afib, and last seen by Jonni Sanger PA on 09/11/2018-overdue/needs appt. Dose is appropriate based on dosing criteria.  Pt needs an appt with Cardiology.

## 2019-12-04 DIAGNOSIS — H25012 Cortical age-related cataract, left eye: Secondary | ICD-10-CM | POA: Diagnosis not present

## 2019-12-04 DIAGNOSIS — H2512 Age-related nuclear cataract, left eye: Secondary | ICD-10-CM | POA: Diagnosis not present

## 2019-12-04 DIAGNOSIS — H25812 Combined forms of age-related cataract, left eye: Secondary | ICD-10-CM | POA: Diagnosis not present

## 2019-12-09 DIAGNOSIS — G5622 Lesion of ulnar nerve, left upper limb: Secondary | ICD-10-CM | POA: Diagnosis not present

## 2019-12-09 DIAGNOSIS — M7022 Olecranon bursitis, left elbow: Secondary | ICD-10-CM | POA: Diagnosis not present

## 2019-12-09 DIAGNOSIS — G5602 Carpal tunnel syndrome, left upper limb: Secondary | ICD-10-CM | POA: Diagnosis not present

## 2019-12-10 NOTE — Telephone Encounter (Addendum)
Pt still needs an appt with Cardiologist; sent another message to scheduler.  Pt was called per the scheduler and pt now has an appt on 12/24/2019 at 1005am; will send in a refill.

## 2019-12-23 NOTE — Progress Notes (Signed)
PCP:  Lavone Orn, MD Primary Cardiologist: No primary care provider on file. Electrophysiologist: Cristopher Peru, MD   Keith Giles is a 86 y.o. male seen today for Cristopher Peru, MD for routine electrophysiology followup.  Since last being seen in our clinic the patient reports doing very well. He has mild chronic SOB with moderate exertion. His example is he has mild SOB if he takes steps "two at a time".  he denies chest pain, palpitations, PND, orthopnea, nausea, vomiting, dizziness, syncope, edema, weight gain, or early satiety.  Past Medical History:  Diagnosis Date  . Arthritis   . Bladder stone   . Bladder tumor   . BPH (benign prostatic hyperplasia)   . Chronic asthmatic bronchitis ([place])   . COPD (chronic obstructive pulmonary disease) ([place])   . Diverticulosis of colon   . Full dentures   . History of adenomatous polyp of colon    2002 HYPERPLASTIC  . History of TIA (transient ischemic attack)    PER PT STATES HAD TIA SEVERAL YRS NO RESIDUAL AND DID NOT SEEK MEDICAL ATTENTION  . Hyperlipidemia   . Hypertension   . IBS (irritable bowel syndrome)   . Microhematuria   . Milk intolerance   . Right inguinal hernia   . Shortness of breath   . Urgency of urination   . Wears glasses   . Wears hearing aid    BILATERAL   Past Surgical History:  Procedure Laterality Date  . CARDIAC CATHETERIZATION  2000   luminal irregularities LAD, CFX, and RCA  no high grade obstruction  . CARDIOVASCULAR STRESS TEST  02-06-2002   fixed mild thinning of the inferior wall w/ no reversible defects identified, no ischemia/  normal LV function and wall motion, ef 69%  . COLONOSCOPY N/A 02/11/2013   Procedure: COLONOSCOPY;  Surgeon: Garlan Fair, MD;  Location: [facility];  Service: Endoscopy;  Laterality: N/A;  . CYSTOSCOPY W/ RETROGRADES Left 06/09/2015   Procedure: CYSTOSCOPY WITH RETROGRADE PYELOGRAM;  Surgeon: Rana Snare, MD;  Location: [facility];  Service:  Urology;  Laterality: Left;  . CYSTOSCOPY WITH URETHRAL DILATATION N/A 06/09/2015   Procedure: CYSTOSCOPY WITH URETHRAL DILATATION;  Surgeon: Rana Snare, MD;  Location: [facility];  Service: Urology;  Laterality: N/A;  . HYDROCELE EXCISION Left 01-03-2006  . INGUINAL HERNIA REPAIR Left 1961  . TONSILLECTOMY    . TRANSURETHRAL RESECTION OF BLADDER TUMOR N/A 06/09/2015   Procedure:  TRANSURETHRAL RESECTION OF BLADDER TUMOR (TURBT);  Surgeon: Rana Snare, MD;  Location: [facility];  Service: Urology;  Laterality: N/A;  . URETEROSCOPY Left 06/09/2015   Procedure: URETEROSCOPY/ STONE EXTRACTION BLADDER;  Surgeon: Rana Snare, MD;  Location: [facility];  Service: Urology;  Laterality: Left;    Current Outpatient Medications  Medication Sig Dispense Refill  . ANORO ELLIPTA 62.5-25 MCG/INH AEPB Inhale 1 puff into the lungs daily.    Marland Kitchen atorvastatin (LIPITOR) 20 MG tablet Take 20 mg by mouth every evening.     Marland Kitchen ELIQUIS 5 MG TABS tablet TAKE 1 TABLET TWICE A DAY 180 tablet 0  . finasteride (PROSCAR) 5 MG tablet Take 1 tablet by mouth daily.  2  . hydrochlorothiazide (MICROZIDE) 12.5 MG capsule Take 12.5 mg by mouth every morning.     . latanoprost (XALATAN) 0.005 % ophthalmic solution Place 1 drop into both eyes daily.  0  . metoprolol tartrate (LOPRESSOR) 50 MG tablet TAKE ONE-HALF (1/2) TABLET TWICE A DAY 90  tablet 3  . tadalafil (CIALIS) 20 MG tablet     . tamsulosin (FLOMAX) 0.4 MG CAPS capsule Take 0.4 mg by mouth daily.     No current facility-administered medications for this visit.    Allergies  Allergen Reactions  . Antihistamines, Diphenhydramine-Type Other (See Comments)    Difficulty  voiding  . Sulfa Antibiotics Rash    Social History   Socioeconomic History  . Marital status: Married    Spouse name: Not on file  . Number of children: Not on file  . Years of education: Not on file  . Highest education level: Not on file    Occupational History  . Not on file  Tobacco Use  . Smoking status: Current Every Day Smoker    Packs/day: 0.25    Years: 60.00    Pack years: 15.00    Types: Cigarettes  . Smokeless tobacco: Never Used  . Tobacco comment: 1 [place] TO 2 WKS  Substance and Sexual Activity  . Alcohol use: Yes    Comment: occasional  . Drug use: No  . Sexual activity: Not on file  Other Topics Concern  . Not on file  Social History Narrative  . Not on file   Social Determinants of Health   Financial Resource Strain:   . Difficulty of Paying Living Expenses: Not on file  Food Insecurity:   . Worried About Charity fundraiser in the Last Year: Not on file  . Ran Out of Food in the Last Year: Not on file  Transportation Needs:   . Lack of Transportation (Medical): Not on file  . Lack of Transportation (Non-Medical): Not on file  Physical Activity:   . Days of Exercise per Week: Not on file  . Minutes of Exercise per Session: Not on file  Stress:   . Feeling of Stress : Not on file  Social Connections:   . Frequency of Communication with Friends and Family: Not on file  . Frequency of Social Gatherings with Friends and Family: Not on file  . Attends Religious Services: Not on file  . Active Member of Clubs or Organizations: Not on file  . Attends Archivist Meetings: Not on file  . Marital Status: Not on file  Intimate Partner Violence:   . Fear of Current or Ex-Partner: Not on file  . Emotionally Abused: Not on file  . Physically Abused: Not on file  . Sexually Abused: Not on file     Review of Systems: General: No chills, fever, night sweats or weight changes  Cardiovascular:  No chest pain, dyspnea on exertion, edema, orthopnea, palpitations, paroxysmal nocturnal dyspnea Dermatological: No rash, lesions or masses Respiratory: No cough, dyspnea Urologic: No hematuria, dysuria Abdominal: No nausea, vomiting, diarrhea, bright red blood per rectum, melena, or  hematemesis Neurologic: No visual changes, weakness, changes in mental status All other systems reviewed and are otherwise negative except as noted above.  Physical Exam: Vitals:   12/24/19 1006  BP: 116/64  Pulse: 60  SpO2: 96%  Weight: 160 lb (72.6 kg)  Height: 5\' 11"  (1.803 m)    GEN- The patient is well appearing, alert and oriented x 3 today.   HEENT: normocephalic, atraumatic; sclera clear, conjunctiva pink; hearing intact; oropharynx clear; neck supple, no JVP Lymph- no cervical lymphadenopathy Lungs- Clear to ausculation bilaterally, normal work of breathing.  No wheezes, rales, rhonchi Heart- Regular rate and rhythm, no murmurs, rubs or gallops, PMI not laterally displaced GI- soft, non-tender, non-distended,  bowel sounds present, no hepatosplenomegaly Extremities- no clubbing, cyanosis, or edema; DP/PT/radial pulses 2+ bilaterally MS- no significant deformity or atrophy Skin- warm and dry, no rash or lesion Psych- euthymic mood, full affect Neuro- strength and sensation are intact  EKG is ordered. Personal review of EKG from today shows NSR with PACs at 60 bpm  Additional studies reviewed include: Previous ep notes  Assessment and Plan:  1. Paroxysmal AF No recurrence that he is aware of Tolerating Eliquis for CHA2DS2VASC of at least 3.    2. HTN Continue current medications.  3. HLD Followed by PCP  Shirley Friar, PA-C  12/24/19 10:23 AM

## 2019-12-24 ENCOUNTER — Encounter: Payer: Self-pay | Admitting: Student

## 2019-12-24 ENCOUNTER — Other Ambulatory Visit: Payer: Self-pay

## 2019-12-24 ENCOUNTER — Ambulatory Visit (INDEPENDENT_AMBULATORY_CARE_PROVIDER_SITE_OTHER): Payer: Medicare Other | Admitting: Student

## 2019-12-24 VITALS — BP 116/64 | HR 60 | Ht 71.0 in | Wt 160.0 lb

## 2019-12-24 DIAGNOSIS — I10 Essential (primary) hypertension: Secondary | ICD-10-CM

## 2019-12-24 DIAGNOSIS — I480 Paroxysmal atrial fibrillation: Secondary | ICD-10-CM | POA: Diagnosis not present

## 2019-12-24 NOTE — Patient Instructions (Signed)
Medication Instructions:  Your physician recommends that you continue on your current medications as directed. Please refer to the Current Medication list given to you today.  *If you need a refill on your cardiac medications before your next appointment, please call your pharmacy*   Lab Work: None ordered   If you have labs (blood work) drawn today and your tests are completely normal, you will receive your results only by: Marland Kitchen MyChart Message (if you have MyChart) OR . A paper copy in the mail If you have any lab test that is abnormal or we need to change your treatment, we will call you to review the results.   Testing/Procedures: None ordered    Follow-Up: At [facility], you and your health needs are our priority.  As part of our continuing mission to provide you with exceptional heart care, we have created designated Provider Care Teams.  These Care Teams include your primary Cardiologist (physician) and Advanced Practice Providers (APPs -  Physician Assistants and Nurse Practitioners) who all work together to provide you with the care you need, when you need it.  We recommend signing up for the patient portal called "MyChart".  Sign up information is provided on this After Visit Summary.  MyChart is used to connect with patients for Virtual Visits (Telemedicine).  Patients are able to view lab/test results, encounter notes, upcoming appointments, etc.  Non-urgent messages can be sent to your provider as well.   To learn more about what you can do with MyChart, go to [email or web address].    Your next appointment:   1 year(s)  The format for your next appointment:   In Person  Provider:   Cristopher Peru, MD   Other Instructions None

## 2020-01-02 DIAGNOSIS — H35372 Puckering of macula, left eye: Secondary | ICD-10-CM | POA: Diagnosis not present

## 2020-01-12 DIAGNOSIS — H35373 Puckering of macula, bilateral: Secondary | ICD-10-CM | POA: Diagnosis not present

## 2020-01-12 DIAGNOSIS — H353132 Nonexudative age-related macular degeneration, bilateral, intermediate dry stage: Secondary | ICD-10-CM | POA: Diagnosis not present

## 2020-01-12 DIAGNOSIS — H35033 Hypertensive retinopathy, bilateral: Secondary | ICD-10-CM | POA: Diagnosis not present

## 2020-02-20 DIAGNOSIS — Z8551 Personal history of malignant neoplasm of bladder: Secondary | ICD-10-CM | POA: Diagnosis not present

## 2020-02-20 DIAGNOSIS — Z0000 Encounter for general adult medical examination without abnormal findings: Secondary | ICD-10-CM | POA: Diagnosis not present

## 2020-02-20 DIAGNOSIS — N401 Enlarged prostate with lower urinary tract symptoms: Secondary | ICD-10-CM | POA: Diagnosis not present

## 2020-02-20 DIAGNOSIS — I700 Atherosclerosis of aorta: Secondary | ICD-10-CM | POA: Diagnosis not present

## 2020-02-20 DIAGNOSIS — F17200 Nicotine dependence, unspecified, uncomplicated: Secondary | ICD-10-CM | POA: Diagnosis not present

## 2020-02-20 DIAGNOSIS — J449 Chronic obstructive pulmonary disease, unspecified: Secondary | ICD-10-CM | POA: Diagnosis not present

## 2020-02-20 DIAGNOSIS — I10 Essential (primary) hypertension: Secondary | ICD-10-CM | POA: Diagnosis not present

## 2020-02-20 DIAGNOSIS — E7800 Pure hypercholesterolemia, unspecified: Secondary | ICD-10-CM | POA: Diagnosis not present

## 2020-02-20 DIAGNOSIS — Z1389 Encounter for screening for other disorder: Secondary | ICD-10-CM | POA: Diagnosis not present

## 2020-02-20 DIAGNOSIS — F5101 Primary insomnia: Secondary | ICD-10-CM | POA: Diagnosis not present

## 2020-02-20 DIAGNOSIS — I480 Paroxysmal atrial fibrillation: Secondary | ICD-10-CM | POA: Diagnosis not present

## 2020-03-02 ENCOUNTER — Other Ambulatory Visit: Payer: Self-pay | Admitting: Internal Medicine

## 2020-03-02 NOTE — Telephone Encounter (Signed)
Eliquis 5mg  refill request received. Patient is 87 years old, weight-72.6kg, Crea-1.28 on 07/12/2019, Diagnosis-Afib, and last seen by Oda Kilts, PA on 12/24/2019. Dose is appropriate based on dosing criteria. Will send in refill to requested pharmacy.

## 2020-04-05 DIAGNOSIS — H401132 Primary open-angle glaucoma, bilateral, moderate stage: Secondary | ICD-10-CM | POA: Diagnosis not present

## 2020-04-05 DIAGNOSIS — H353132 Nonexudative age-related macular degeneration, bilateral, intermediate dry stage: Secondary | ICD-10-CM | POA: Diagnosis not present

## 2020-04-05 DIAGNOSIS — H35033 Hypertensive retinopathy, bilateral: Secondary | ICD-10-CM | POA: Diagnosis not present

## 2020-04-05 DIAGNOSIS — H35373 Puckering of macula, bilateral: Secondary | ICD-10-CM | POA: Diagnosis not present

## 2020-06-04 DIAGNOSIS — Z20822 Contact with and (suspected) exposure to covid-19: Secondary | ICD-10-CM | POA: Diagnosis not present

## 2020-06-04 DIAGNOSIS — Z03818 Encounter for observation for suspected exposure to other biological agents ruled out: Secondary | ICD-10-CM | POA: Diagnosis not present

## 2020-06-07 DIAGNOSIS — Z03818 Encounter for observation for suspected exposure to other biological agents ruled out: Secondary | ICD-10-CM | POA: Diagnosis not present

## 2020-06-07 DIAGNOSIS — Z20822 Contact with and (suspected) exposure to covid-19: Secondary | ICD-10-CM | POA: Diagnosis not present

## 2020-06-09 DIAGNOSIS — U071 COVID-19: Secondary | ICD-10-CM | POA: Diagnosis not present

## 2020-06-14 DIAGNOSIS — Z20822 Contact with and (suspected) exposure to covid-19: Secondary | ICD-10-CM | POA: Diagnosis not present

## 2020-06-14 DIAGNOSIS — Z03818 Encounter for observation for suspected exposure to other biological agents ruled out: Secondary | ICD-10-CM | POA: Diagnosis not present

## 2020-07-01 DIAGNOSIS — K589 Irritable bowel syndrome without diarrhea: Secondary | ICD-10-CM | POA: Diagnosis not present

## 2020-07-01 DIAGNOSIS — N401 Enlarged prostate with lower urinary tract symptoms: Secondary | ICD-10-CM | POA: Diagnosis not present

## 2020-08-06 DIAGNOSIS — H401133 Primary open-angle glaucoma, bilateral, severe stage: Secondary | ICD-10-CM | POA: Diagnosis not present

## 2020-08-06 DIAGNOSIS — H26492 Other secondary cataract, left eye: Secondary | ICD-10-CM | POA: Diagnosis not present

## 2020-08-16 DIAGNOSIS — N401 Enlarged prostate with lower urinary tract symptoms: Secondary | ICD-10-CM | POA: Diagnosis not present

## 2020-08-16 DIAGNOSIS — J449 Chronic obstructive pulmonary disease, unspecified: Secondary | ICD-10-CM | POA: Diagnosis not present

## 2020-08-16 DIAGNOSIS — K589 Irritable bowel syndrome without diarrhea: Secondary | ICD-10-CM | POA: Diagnosis not present

## 2020-08-16 DIAGNOSIS — I10 Essential (primary) hypertension: Secondary | ICD-10-CM | POA: Diagnosis not present

## 2020-08-30 ENCOUNTER — Other Ambulatory Visit: Payer: Self-pay | Admitting: Internal Medicine

## 2020-08-30 NOTE — Telephone Encounter (Signed)
Prescription refill request for Eliquis received. Indication: Afib Last office visit: 12/24/19 ([place]) Scr: 1.080 via [facility] (02/20/20) Age: 87 Weight: 72.6kg  Appropriate dose and refill sent to requested pharmacy.

## 2020-10-11 DIAGNOSIS — D3131 Benign neoplasm of right choroid: Secondary | ICD-10-CM | POA: Diagnosis not present

## 2020-10-11 DIAGNOSIS — H401132 Primary open-angle glaucoma, bilateral, moderate stage: Secondary | ICD-10-CM | POA: Diagnosis not present

## 2020-10-11 DIAGNOSIS — H353132 Nonexudative age-related macular degeneration, bilateral, intermediate dry stage: Secondary | ICD-10-CM | POA: Diagnosis not present

## 2020-10-11 DIAGNOSIS — H35373 Puckering of macula, bilateral: Secondary | ICD-10-CM | POA: Diagnosis not present

## 2020-10-11 DIAGNOSIS — H35033 Hypertensive retinopathy, bilateral: Secondary | ICD-10-CM | POA: Diagnosis not present

## 2020-10-14 ENCOUNTER — Other Ambulatory Visit: Payer: Self-pay

## 2020-10-14 MED ORDER — METOPROLOL TARTRATE 50 MG PO TABS: ORAL_TABLET | ORAL | 0 refills | Status: DC

## 2020-10-14 NOTE — Telephone Encounter (Signed)
Pt's medication was sent to pt's pharmacy as requested. Confirmation received.  °

## 2020-11-01 ENCOUNTER — Other Ambulatory Visit: Payer: Self-pay

## 2020-11-01 ENCOUNTER — Ambulatory Visit (INDEPENDENT_AMBULATORY_CARE_PROVIDER_SITE_OTHER): Payer: Medicare Other | Admitting: Dermatology

## 2020-11-01 DIAGNOSIS — L219 Seborrheic dermatitis, unspecified: Secondary | ICD-10-CM

## 2020-11-01 DIAGNOSIS — R21 Rash and other nonspecific skin eruption: Secondary | ICD-10-CM | POA: Diagnosis not present

## 2020-11-01 MED ORDER — TACROLIMUS 0.1 % EX OINT: TOPICAL_OINTMENT | CUTANEOUS | 1 refills | Status: DC

## 2020-11-01 MED ORDER — CLOBETASOL PROPIONATE 0.05 % EX SOLN: CUTANEOUS | 1 refills | Status: DC

## 2020-11-01 MED ORDER — KETOCONAZOLE 2 % EX SHAM: MEDICATED_SHAMPOO | CUTANEOUS | 2 refills | Status: DC

## 2020-11-01 MED ORDER — FLUOCINONIDE 0.05 % EX SOLN: CUTANEOUS | 1 refills | Status: DC

## 2020-11-01 NOTE — Progress Notes (Signed)
Follow-Up Visit   Subjective  Keith Giles is a 87 y.o. male who presents for the following: Rash (Arms, scalp, legs x several weeks. He is using OTC hydrocortisone cream 1%. He uses Dial soap and CeraVe Cream.).  He has had h/o dermatitis in past requiring topical steroid to treat.  He has seasonal allergies and severe asthma runs in family.  He has COPD.   The following portions of the chart were reviewed this encounter and updated as appropriate:       Review of Systems:  No other skin or systemic complaints except as noted in HPI or Assessment and Plan.  Objective  Well appearing patient in no apparent distress; mood and affect are within normal limits.  A focused examination was performed including face, arms, legs, scalp. Relevant physical exam findings are noted in the Assessment and Plan.  arms, legs, scalp Xerosis with mild erythema on the arms.  Scalp Scalp clear with mild erythema   Assessment & Plan  Rash arms, legs, scalp  Possible Atopic Dermatitis, with flare  Start Clobetasol solution mixed in CeraVe Cream Apply BID to AAs arms and legs prn itch dsp 49mL. Avoid face, groin, axilla.   Start tacrolimus ointment apply to arms, legs once a day (after Clobetasol/CeraVe mix) dsp 100g 1R  Topical steroids (such as triamcinolone, fluocinolone, fluocinonide, mometasone, clobetasol, halobetasol, betamethasone, hydrocortisone) can cause thinning and lightening of the skin if they are used for too long in the same area. Your physician has selected the right strength medicine for your problem and area affected on the body. Please use your medication only as directed by your physician to prevent side effects.   Recommend OTC Gold Bond Rapid Relief Anti-Itch cream (pramoxine + menthol), CeraVe Anti-itch cream or lotion (pramoxine), Sarna lotion (Original- menthol + camphor or Sensitive- pramoxine) or Eucerin 12 hour Itch Relief lotion (menthol) up to 3 times per day to  areas on body that are itchy.    Discussed Dupixent injections if not improving with topicals.   Recommend mild soap (Dove) and moisturizing cream 1-2 times daily.       clobetasol (TEMOVATE) 0.05 % external solution - arms, legs, scalp Patient to mix solution in jar of CeraVe Cream and apply twice daily to arms and legs until rash improved.  tacrolimus (PROTOPIC) 0.1 % ointment - arms, legs, scalp Apply to rash on arms and legs once a day until improved. (Apply after clobetasol/CeraVe mix)  Seborrheic dermatitis Scalp  Seborrheic Dermatitis  -  is a chronic persistent rash characterized by pinkness and scaling most commonly of the mid face but also can occur on the scalp (dandruff), ears; mid chest, mid back and groin.  It tends to be exacerbated by stress and cooler weather.  People who have neurologic disease may experience new onset or exacerbation of existing seborrheic dermatitis.  The condition is not curable but treatable and can be controlled.  Start ketoconazole 2% shampoo apply to scalp and let sit several minutes before rinsing  Start fluocinonide solution qd/bid to aas scalp prn itch  ketoconazole (NIZORAL) 2 % shampoo - Scalp Apply to scalp 2-3 times a week and let sit several minutes before rinsing.  fluocinonide (LIDEX) 0.05 % external solution - Scalp Apply to affected areas scalp 1-2 times a day as needed for itch. Avoid face, groin, axilla.  Return in 15 days (on 11/16/2020), or as scheduled with Dr Nicole Kindred.  IJamesetta Orleans, CMA, am acting as scribe for [employer],  MD .  Documentation: I have reviewed the above documentation for accuracy and completeness, and I agree with the above.  Brendolyn Patty MD

## 2020-11-01 NOTE — Patient Instructions (Addendum)
Eczema Skin Care  Buy TWO 16oz jars of CeraVe moisturizing cream  [facility], [facility], [facility] (no prescription needed)  Costs about $15 per jar   Jar #1: Use as a moisturizer as needed. Can be applied to any area of the body. Use twice daily to unaffected areas.  Jar #2: Pour one 52ml bottle of clobetasol 0.05% solution into jar, mix well. Label this jar to indicate the medication has been added. Use twice daily to affected areas. Do not apply to face, groin or underarms.  Moisturizer may burn or sting initially. Try for at least 4 weeks.    Recommend OTC Gold Bond Rapid Relief Anti-Itch cream (pramoxine + menthol), CeraVe Anti-itch cream or lotion (pramoxine), or Sarna lotion (Original- menthol + camphor or Sensitive- pramoxine) up to 3 times per day to areas on body that are itchy.    If you have any questions or concerns for your doctor, please call our main line at [phone] and press option 4 to reach your doctor's medical assistant. If no one answers, please leave a voicemail as directed and we will return your call as soon as possible. Messages left after 4 pm will be answered the following business day.   You may also send [place] a message via [place]. We typically respond to MyChart messages within 1-2 business days.  For prescription refills, please ask your pharmacy to contact our office. Our fax number is [phone].  If you have an urgent issue when the clinic is closed that cannot wait until the next business day, you can page your doctor at the number below.    Please note that while we do our best to be available for urgent issues outside of office hours, we are not available 24/7.   If you have an urgent issue and are unable to reach [place], you may choose to seek medical care at your doctor's office, retail clinic, urgent care center, or emergency room.  If you have a medical emergency, please immediately call 911 or go to the emergency department.  Pager Numbers  - Dr.  Nehemiah Massed: [phone]  - Dr. Laurence Ferrari: [phone]  - Dr. Nicole Kindred: [phone]  In the event of inclement weather, please call our main line at [phone] for an update on the status of any delays or closures.  Dermatology Medication Tips: Please keep the boxes that topical medications come in in order to help keep track of the instructions about where and how to use these. Pharmacies typically print the medication instructions only on the boxes and not directly on the medication tubes.   If your medication is too expensive, please contact our office at [phone] option 4 or send [place] a message through [place].   We are unable to tell what your co-pay for medications will be in advance as this is different depending on your insurance coverage. However, we may be able to find a substitute medication at lower cost or fill out paperwork to get insurance to cover a needed medication.   If a prior authorization is required to get your medication covered by your insurance company, please allow [place] 1-2 business days to complete this process.  Drug prices often vary depending on where the prescription is filled and some pharmacies may offer cheaper prices.  The website www.goodrx.com contains coupons for medications through different pharmacies. The prices here do not account for what the cost may be with help from insurance (it may be cheaper with your insurance), but the website can give you the  price if you did not use any insurance.  - You can print the associated coupon and take it with your prescription to the pharmacy.  - You may also stop by our office during regular business hours and pick up a GoodRx coupon card.  - If you need your prescription sent electronically to a different pharmacy, notify our office through [facility] or by phone at [phone] option 4.

## 2020-11-04 ENCOUNTER — Ambulatory Visit: Payer: Medicare Other | Admitting: Dermatology

## 2020-11-04 DIAGNOSIS — Z09 Encounter for follow-up examination after completed treatment for conditions other than malignant neoplasm: Secondary | ICD-10-CM | POA: Diagnosis not present

## 2020-11-04 DIAGNOSIS — Z96642 Presence of left artificial hip joint: Secondary | ICD-10-CM | POA: Diagnosis not present

## 2020-11-04 DIAGNOSIS — M7632 Iliotibial band syndrome, left leg: Secondary | ICD-10-CM | POA: Diagnosis not present

## 2020-11-08 DIAGNOSIS — N200 Calculus of kidney: Secondary | ICD-10-CM | POA: Diagnosis not present

## 2020-11-08 DIAGNOSIS — N5201 Erectile dysfunction due to arterial insufficiency: Secondary | ICD-10-CM | POA: Diagnosis not present

## 2020-11-08 DIAGNOSIS — C674 Malignant neoplasm of posterior wall of bladder: Secondary | ICD-10-CM | POA: Diagnosis not present

## 2020-11-13 DIAGNOSIS — Z23 Encounter for immunization: Secondary | ICD-10-CM | POA: Diagnosis not present

## 2020-11-16 ENCOUNTER — Other Ambulatory Visit: Payer: Self-pay

## 2020-11-16 ENCOUNTER — Ambulatory Visit (INDEPENDENT_AMBULATORY_CARE_PROVIDER_SITE_OTHER): Payer: Medicare Other | Admitting: Dermatology

## 2020-11-16 DIAGNOSIS — L219 Seborrheic dermatitis, unspecified: Secondary | ICD-10-CM | POA: Diagnosis not present

## 2020-11-16 DIAGNOSIS — L720 Epidermal cyst: Secondary | ICD-10-CM

## 2020-11-16 DIAGNOSIS — D1800 Hemangioma unspecified site: Secondary | ICD-10-CM | POA: Diagnosis not present

## 2020-11-16 DIAGNOSIS — D224 Melanocytic nevi of scalp and neck: Secondary | ICD-10-CM

## 2020-11-16 DIAGNOSIS — L820 Inflamed seborrheic keratosis: Secondary | ICD-10-CM | POA: Diagnosis not present

## 2020-11-16 DIAGNOSIS — L309 Dermatitis, unspecified: Secondary | ICD-10-CM

## 2020-11-16 DIAGNOSIS — Z1283 Encounter for screening for malignant neoplasm of skin: Secondary | ICD-10-CM | POA: Diagnosis not present

## 2020-11-16 DIAGNOSIS — L821 Other seborrheic keratosis: Secondary | ICD-10-CM | POA: Diagnosis not present

## 2020-11-16 DIAGNOSIS — L578 Other skin changes due to chronic exposure to nonionizing radiation: Secondary | ICD-10-CM

## 2020-11-16 DIAGNOSIS — D229 Melanocytic nevi, unspecified: Secondary | ICD-10-CM

## 2020-11-16 DIAGNOSIS — L814 Other melanin hyperpigmentation: Secondary | ICD-10-CM

## 2020-11-16 DIAGNOSIS — D225 Melanocytic nevi of trunk: Secondary | ICD-10-CM

## 2020-11-16 DIAGNOSIS — D2262 Melanocytic nevi of left upper limb, including shoulder: Secondary | ICD-10-CM | POA: Diagnosis not present

## 2020-11-16 NOTE — Progress Notes (Signed)
Follow-Up Visit   Subjective  Keith Giles is a 87 y.o. male who presents for the following: Follow-up (Patient here today for full body exam. He reports a spot at right lower leg he has had for over a year. He denies other concerns today. ).  He has itchy spots on his back and his leg.  He is also here to f/up on itchy rash body and scalp, which is doing better with treatment.   Patient here for full body skin exam and skin cancer screening.  The following portions of the chart were reviewed this encounter and updated as appropriate:      Review of Systems: No other skin or systemic complaints except as noted in HPI or Assessment and Plan.   Objective  Well appearing patient in no apparent distress; mood and affect are within normal limits.  A full examination was performed including scalp, head, eyes, ears, nose, lips, neck, chest, axillae, abdomen, back, buttocks, bilateral upper extremities, bilateral lower extremities, hands, feet, fingers, toes, fingernails, and toenails. All findings within normal limits unless otherwise noted below.  left upper arm 2.0 mm med dark brown macule   Left Postauricular scalp 1.0 cm flesh pap  Left upper back paraspinal 4 mm pink/flesh papule with telangiectasia   Left Upper Back x 2, left medial calf x 1 (3) Erythematous keratotic or waxy stuck-on papule   Scalp Clear today at exam   left neck and right lower back at waistline 1.5 cm SQ nodule at left neck, 1.0 cm SQ nodule at right lower back at waistline, no changes per pt, not bothersome. Cyst at left neck history 25 + years  Left Upper Back Pink scaly patches on right posterior shoulder. Mild erythema with scale at left lateral leg.   Assessment & Plan  Nevus (3) left upper arm; Left upper back paraspinal; Left Postauricular scalp  Benign-appearing.  Stable. Observation.  Call clinic for new or changing lesions.  Recommend daily use of broad spectrum spf 30+ sunscreen to  sun-exposed areas.    Inflamed seborrheic keratosis Left Upper Back x 2, left medial calf x 1  Destruction of lesion - Left Upper Back x 2, left medial calf x 1  Destruction method: cryotherapy   Informed consent: discussed and consent obtained   Lesion destroyed using liquid nitrogen: Yes   Region frozen until ice ball extended beyond lesion: Yes   Outcome: patient tolerated procedure well with no complications   Post-procedure details: wound care instructions given   Additional details:  Prior to procedure, discussed risks of blister formation, small wound, skin dyspigmentation, or rare scar following cryotherapy. Recommend Vaseline ointment to treated areas while healing.   Seborrheic dermatitis Scalp  Pruritus has improved Seborrheic Dermatitis  -  is a chronic persistent rash characterized by pinkness and scaling most commonly of the mid face but also can occur on the scalp (dandruff), ears; mid chest, mid back and groin.  It tends to be exacerbated by stress and cooler weather.  People who have neurologic disease may experience new onset or exacerbation of existing seborrheic dermatitis.  The condition is not curable but treatable and can be controlled.  Continue Ketoconazole 2 % shampoo 2-3x/week  Continue Fluocinonide 0.05 % external solution qd/bid prn itch    Related Medications ketoconazole (NIZORAL) 2 % shampoo Apply to scalp 2-3 times a week and let sit several minutes before rinsing.  fluocinonide (LIDEX) 0.05 % external solution Apply to affected areas scalp 1-2 times a  day as needed for itch. Avoid face, groin, axilla.  Epidermoid cyst of skin left neck and right lower back at waistline  Benign-appearing. Exam most consistent with an epidermal inclusion cyst. Discussed that a cyst is a benign growth that can grow over time and sometimes get irritated or inflamed. Recommend observation if it is not bothersome. Discussed option of surgical excision to remove it if  it is growing, symptomatic, or other changes noted. Please call for new or changing lesions so they can be evaluated.    Eczema, unspecified type Left Upper Back  Improving  Atopic dermatitis (eczema) is a chronic, relapsing, pruritic condition that can significantly affect quality of life. It is often associated with allergic rhinitis and/or asthma and can require treatment with topical medications, phototherapy, or in severe cases a biologic medication called Dupixent in children and adults.   Continue using plain Cerave cream qd, has not made/used clobetasol/CeraVe mixture yet  Continue using tacrolimus 0.1% ointment qd/bid  Continue treatment if gets worse there are other options for eczema such as dupixent   Lentigines - Scattered tan macules - Due to sun exposure - Benign-appearing, observe - Recommend daily broad spectrum sunscreen SPF 30+ to sun-exposed areas, reapply every 2 hours as needed. - Call for any changes  Seborrheic Keratoses - Stuck-on, waxy, tan-brown papules and/or plaques  - Benign-appearing - Discussed benign etiology and prognosis. - Observe - Call for any changes  Melanocytic Nevi - Tan-brown and/or pink-flesh-colored symmetric macules and papules - Benign appearing on exam today - Observation - Call clinic for new or changing moles - Recommend daily use of broad spectrum spf 30+ sunscreen to sun-exposed areas.   Hemangiomas - Red papules - Discussed benign nature - Observe - Call for any changes  Actinic Damage - Chronic condition, secondary to cumulative UV/sun exposure - diffuse scaly erythematous macules with underlying dyspigmentation - Recommend daily broad spectrum sunscreen SPF 30+ to sun-exposed areas, reapply every 2 hours as needed.  - Staying in the shade or wearing long sleeves, sun glasses (UVA+UVB protection) and wide brim hats (4-inch brim around the entire circumference of the hat) are also recommended for sun protection.  -  Call for new or changing lesions.  Skin cancer screening performed today.  Return in about 1 year (around 11/16/2021) for tbse. I, Ruthell Rummage, CMA, am acting as scribe for Brendolyn Patty, MD.  Documentation: I have reviewed the above documentation for accuracy and completeness, and I agree with the above.  Brendolyn Patty MD

## 2020-11-16 NOTE — Patient Instructions (Signed)
Cryotherapy Aftercare  Wash gently with soap and water everyday.   Apply Vaseline and Band-Aid daily until healed.     Melanoma ABCDEs  Melanoma is the most dangerous type of skin cancer, and is the leading cause of death from skin disease.  You are more likely to develop melanoma if you: Have light-colored skin, light-colored eyes, or red or blond hair Spend a lot of time in the sun Tan regularly, either outdoors or in a tanning bed Have had blistering sunburns, especially during childhood Have a close family member who has had a melanoma Have atypical moles or large birthmarks  Early detection of melanoma is key since treatment is typically straightforward and cure rates are extremely high if we catch it early.   The first sign of melanoma is often a change in a mole or a new dark spot.  The ABCDE system is a way of remembering the signs of melanoma.  A for asymmetry:  The two halves do not match. B for border:  The edges of the growth are irregular. C for color:  A mixture of colors are present instead of an even brown color. D for diameter:  Melanomas are usually (but not always) greater than 68mm - the size of a pencil eraser. E for evolution:  The spot keeps changing in size, shape, and color.  Please check your skin once per month between visits. You can use a small mirror in front and a large mirror behind you to keep an eye on the back side or your body.   If you see any new or changing lesions before your next follow-up, please call to schedule a visit.  Please continue daily skin protection including broad spectrum sunscreen SPF 30+ to sun-exposed areas, reapplying every 2 hours as needed when you're outdoors.   Staying in the shade or wearing long sleeves, sun glasses (UVA+UVB protection) and wide brim hats (4-inch brim around the entire circumference of the hat) are also recommended for sun protection.    If you have any questions or concerns for your doctor, please  call our main line at [phone] and press option 4 to reach your doctor's medical assistant. If no one answers, please leave a voicemail as directed and we will return your call as soon as possible. Messages left after 4 pm will be answered the following business day.   You may also send [place] a message via [place]. We typically respond to MyChart messages within 1-2 business days.  For prescription refills, please ask your pharmacy to contact our office. Our fax number is [phone].  If you have an urgent issue when the clinic is closed that cannot wait until the next business day, you can page your doctor at the number below.    Please note that while we do our best to be available for urgent issues outside of office hours, we are not available 24/7.   If you have an urgent issue and are unable to reach [place], you may choose to seek medical care at your doctor's office, retail clinic, urgent care center, or emergency room.  If you have a medical emergency, please immediately call 911 or go to the emergency department.  Pager Numbers  - Dr. Nehemiah Massed: [phone]  - Dr. Laurence Ferrari: [phone]  - Dr. Nicole Kindred: [phone]  In the event of inclement weather, please call our main line at [phone] for an update on the status of any delays or closures.  Dermatology Medication Tips: Please keep the boxes  that topical medications come in in order to help keep track of the instructions about where and how to use these. Pharmacies typically print the medication instructions only on the boxes and not directly on the medication tubes.   If your medication is too expensive, please contact our office at [phone] option 4 or send [place] a message through [place].   We are unable to tell what your co-pay for medications will be in advance as this is different depending on your insurance coverage. However, we may be able to find a substitute medication at lower cost or fill out paperwork to get  insurance to cover a needed medication.   If a prior authorization is required to get your medication covered by your insurance company, please allow [place] 1-2 business days to complete this process.  Drug prices often vary depending on where the prescription is filled and some pharmacies may offer cheaper prices.  The website www.goodrx.com contains coupons for medications through different pharmacies. The prices here do not account for what the cost may be with help from insurance (it may be cheaper with your insurance), but the website can give you the price if you did not use any insurance.  - You can print the associated coupon and take it with your prescription to the pharmacy.  - You may also stop by our office during regular business hours and pick up a GoodRx coupon card.  - If you need your prescription sent electronically to a different pharmacy, notify our office through [facility] or by phone at [phone] option 4.

## 2020-11-29 ENCOUNTER — Other Ambulatory Visit: Payer: Self-pay | Admitting: *Deleted

## 2020-11-29 MED ORDER — METOPROLOL TARTRATE 50 MG PO TABS: ORAL_TABLET | ORAL | 0 refills | Status: DC

## 2020-12-07 DIAGNOSIS — Z20822 Contact with and (suspected) exposure to covid-19: Secondary | ICD-10-CM | POA: Diagnosis not present

## 2020-12-07 DIAGNOSIS — Z03818 Encounter for observation for suspected exposure to other biological agents ruled out: Secondary | ICD-10-CM | POA: Diagnosis not present

## 2020-12-10 DIAGNOSIS — Z20822 Contact with and (suspected) exposure to covid-19: Secondary | ICD-10-CM | POA: Diagnosis not present

## 2020-12-10 DIAGNOSIS — Z03818 Encounter for observation for suspected exposure to other biological agents ruled out: Secondary | ICD-10-CM | POA: Diagnosis not present

## 2020-12-13 DIAGNOSIS — Z20822 Contact with and (suspected) exposure to covid-19: Secondary | ICD-10-CM | POA: Diagnosis not present

## 2020-12-13 DIAGNOSIS — Z03818 Encounter for observation for suspected exposure to other biological agents ruled out: Secondary | ICD-10-CM | POA: Diagnosis not present

## 2020-12-28 ENCOUNTER — Ambulatory Visit (INDEPENDENT_AMBULATORY_CARE_PROVIDER_SITE_OTHER): Payer: Medicare Other | Admitting: Internal Medicine

## 2020-12-28 ENCOUNTER — Other Ambulatory Visit: Payer: Self-pay

## 2020-12-28 VITALS — BP 102/44 | HR 55 | Ht 71.5 in | Wt 165.5 lb

## 2020-12-28 DIAGNOSIS — I480 Paroxysmal atrial fibrillation: Secondary | ICD-10-CM | POA: Diagnosis not present

## 2020-12-28 DIAGNOSIS — I10 Essential (primary) hypertension: Secondary | ICD-10-CM | POA: Diagnosis not present

## 2020-12-28 MED ORDER — METOPROLOL SUCCINATE ER 25 MG PO TB24: 25.0000 mg | ORAL_TABLET | Freq: Every day | ORAL | 3 refills | Status: DC

## 2020-12-28 NOTE — Progress Notes (Signed)
HPI Mr. Stockley returns today for evaluation of atrial fib. He came in with new onset atrial fib and reverted back to NSR. He feels well. No chest pain or sob. No syncope. No edema. His 2D echo demonstrates preserved LV function with a moderately dilated LA and mildly dilated RV. He feels well and denies chest pain or sob.  Allergies  Allergen Reactions   Antihistamines, Diphenhydramine-Type Other (See Comments)    Difficulty  voiding   Sulfa Antibiotics Rash     Current Outpatient Medications  Medication Sig Dispense Refill   ANORO ELLIPTA 62.5-25 MCG/INH AEPB Inhale 1 puff into the lungs daily.     atorvastatin (LIPITOR) 20 MG tablet Take 20 mg by mouth every evening.      clobetasol (TEMOVATE) 0.05 % external solution Patient to mix solution in jar of CeraVe Cream and apply twice daily to arms and legs until rash improved. 50 mL 1   ELIQUIS 5 MG TABS tablet TAKE 1 TABLET TWICE A DAY 180 tablet 3   finasteride (PROSCAR) 5 MG tablet Take 1 tablet by mouth daily.  2   fluocinonide (LIDEX) 0.05 % external solution Apply to affected areas scalp 1-2 times a day as needed for itch. Avoid face, groin, axilla. 60 mL 1   hydrochlorothiazide (MICROZIDE) 12.5 MG capsule Take 12.5 mg by mouth every morning.      ketoconazole (NIZORAL) 2 % shampoo Apply to scalp 2-3 times a week and let sit several minutes before rinsing. 120 mL 2   latanoprost (XALATAN) 0.005 % ophthalmic solution Place 1 drop into both eyes daily.  0   metoprolol tartrate (LOPRESSOR) 50 MG tablet TAKE ONE-HALF (1/2) TABLET TWICE A DAY (Patient taking differently: Take 25 mg by mouth daily. TAKE ONE-HALF (1/2) TABLET A DAILY) 90 tablet 0   tacrolimus (PROTOPIC) 0.1 % ointment Apply to rash on arms and legs once a day until improved. (Apply after clobetasol/CeraVe mix) 100 g 1   tadalafil (CIALIS) 20 MG tablet      tamsulosin (FLOMAX) 0.4 MG CAPS capsule Take 0.4 mg by mouth daily.     No current facility-administered  medications for this visit.     Past Medical History:  Diagnosis Date   Arthritis    Bladder stone    Bladder tumor    BPH (benign prostatic hyperplasia)    Chronic asthmatic bronchitis (HCC)    COPD (chronic obstructive pulmonary disease) (HCC)    Diverticulosis of colon    Full dentures    History of adenomatous polyp of colon    2002 HYPERPLASTIC   History of TIA (transient ischemic attack)    PER PT STATES HAD TIA SEVERAL YRS NO RESIDUAL AND DID NOT SEEK MEDICAL ATTENTION   Hyperlipidemia    Hypertension    IBS (irritable bowel syndrome)    Microhematuria    Milk intolerance    Right inguinal hernia    Shortness of breath    Urgency of urination    Wears glasses    Wears hearing aid    BILATERAL    ROS:   All systems reviewed and negative except as noted in the HPI.   Past Surgical History:  Procedure Laterality Date   CARDIAC CATHETERIZATION  2000   luminal irregularities LAD, CFX, and RCA  no high grade obstruction   CARDIOVASCULAR STRESS TEST  02-06-2002   fixed mild thinning of the inferior wall w/ no reversible defects identified, no ischemia/  normal LV function  and wall motion, ef 69%   COLONOSCOPY N/A 02/11/2013   Procedure: COLONOSCOPY;  Surgeon: Garlan Fair, MD;  Location: [facility];  Service: Endoscopy;  Laterality: N/A;   CYSTOSCOPY W/ RETROGRADES Left 06/09/2015   Procedure: CYSTOSCOPY WITH RETROGRADE PYELOGRAM;  Surgeon: Rana Snare, MD;  Location: [facility];  Service: Urology;  Laterality: Left;   CYSTOSCOPY WITH URETHRAL DILATATION N/A 06/09/2015   Procedure: CYSTOSCOPY WITH URETHRAL DILATATION;  Surgeon: Rana Snare, MD;  Location: [facility];  Service: Urology;  Laterality: N/A;   HYDROCELE EXCISION Left 01-03-2006   INGUINAL HERNIA REPAIR Left 1961   TONSILLECTOMY     TRANSURETHRAL RESECTION OF BLADDER TUMOR N/A 06/09/2015   Procedure:  TRANSURETHRAL RESECTION OF BLADDER TUMOR (TURBT);  Surgeon: Rana Snare, MD;  Location: [facility];  Service: Urology;  Laterality: N/A;   URETEROSCOPY Left 06/09/2015   Procedure: URETEROSCOPY/ STONE EXTRACTION BLADDER;  Surgeon: Rana Snare, MD;  Location: [facility];  Service: Urology;  Laterality: Left;     No family history on file.   Social History   Socioeconomic History   Marital status: Married    Spouse name: Not on file   Number of children: Not on file   Years of education: Not on file   Highest education level: Not on file  Occupational History   Not on file  Tobacco Use   Smoking status: Every Day    Packs/day: 0.25    Years: 60.00    Pack years: 15.00    Types: Cigarettes   Smokeless tobacco: Never   Tobacco comments:    1 PPWK TO 2 WKS  Substance and Sexual Activity   Alcohol use: Yes    Comment: occasional   Drug use: No   Sexual activity: Not on file  Other Topics Concern   Not on file  Social History Narrative   Not on file   Social Determinants of Health   Financial Resource Strain: Not on file  Food Insecurity: Not on file  Transportation Needs: Not on file  Physical Activity: Not on file  Stress: Not on file  Social Connections: Not on file  Intimate Partner Violence: Not on file     BP (!) 102/44   Pulse (!) 55   Ht 5' 11.5" (1.816 m)   Wt 165 lb 8 oz (75.1 kg)   SpO2 94%   BMI 22.76 kg/m   Physical Exam:  Well appearing NAD HEENT: Unremarkable Neck:  No JVD, no thyromegally Lymphatics:  No adenopathy Back:  No CVA tenderness Lungs:  Clear HEART:  Regular rate rhythm, no murmurs, no rubs, no clicks Abd:  soft, positive bowel sounds, no organomegally, no rebound, no guarding Ext:  2 plus pulses, no edema, no cyanosis, no clubbing Skin:  No rashes no nodules Neuro:  CN II through XII intact, motor grossly intact  EKG - NSR with atrial bigeminy  DEVICE  Normal device function.  See PaceArt for details.   Assess/Plan:  1. PAF - he has reverted back to  NSR. He will continue his systemic anti-coagulation. 2. HTN - his pressure is reasonably well controlled. I have instructed him to continue his metoprolol unless the rate gets too slow. 3. Coags - we will have him continue Eliquis as he is at risk for more atrial fib and stroke.   Mikle Bosworth.D.

## 2020-12-28 NOTE — Patient Instructions (Addendum)
Medication Instructions:   Your physician has recommended you make the following change in your medication:    STOP taking hydrochlorothiazide  2.    STOP taking metoprolol tartrate  3.  START taking metoprolol succinate 25 mg-  Take one tablet by mouth daily  Labwork: None ordered.  Testing/Procedures: None ordered.  Follow-Up: Your physician wants you to follow-up in: one year with Tommye Standard, PA-C   Any Other Special Instructions Will Be Listed Below (If Applicable).  If you need a refill on your cardiac medications before your next appointment, please call your pharmacy.

## 2021-01-04 ENCOUNTER — Other Ambulatory Visit: Payer: Self-pay

## 2021-01-04 DIAGNOSIS — R21 Rash and other nonspecific skin eruption: Secondary | ICD-10-CM

## 2021-01-04 MED ORDER — CLOBETASOL PROPIONATE 0.05 % EX SOLN: CUTANEOUS | 1 refills | Status: DC

## 2021-01-04 NOTE — Progress Notes (Signed)
Pt requested new pharmacy  

## 2021-01-05 IMAGING — CR CHEST - 2 VIEW
2 series · 2 of 2 positions shown · non-contrast
Comparison: June 24, 2017

CLINICAL DATA: Weight loss.  Bronchitis.

EXAM:
CHEST - 2 VIEW

[w chest pa]
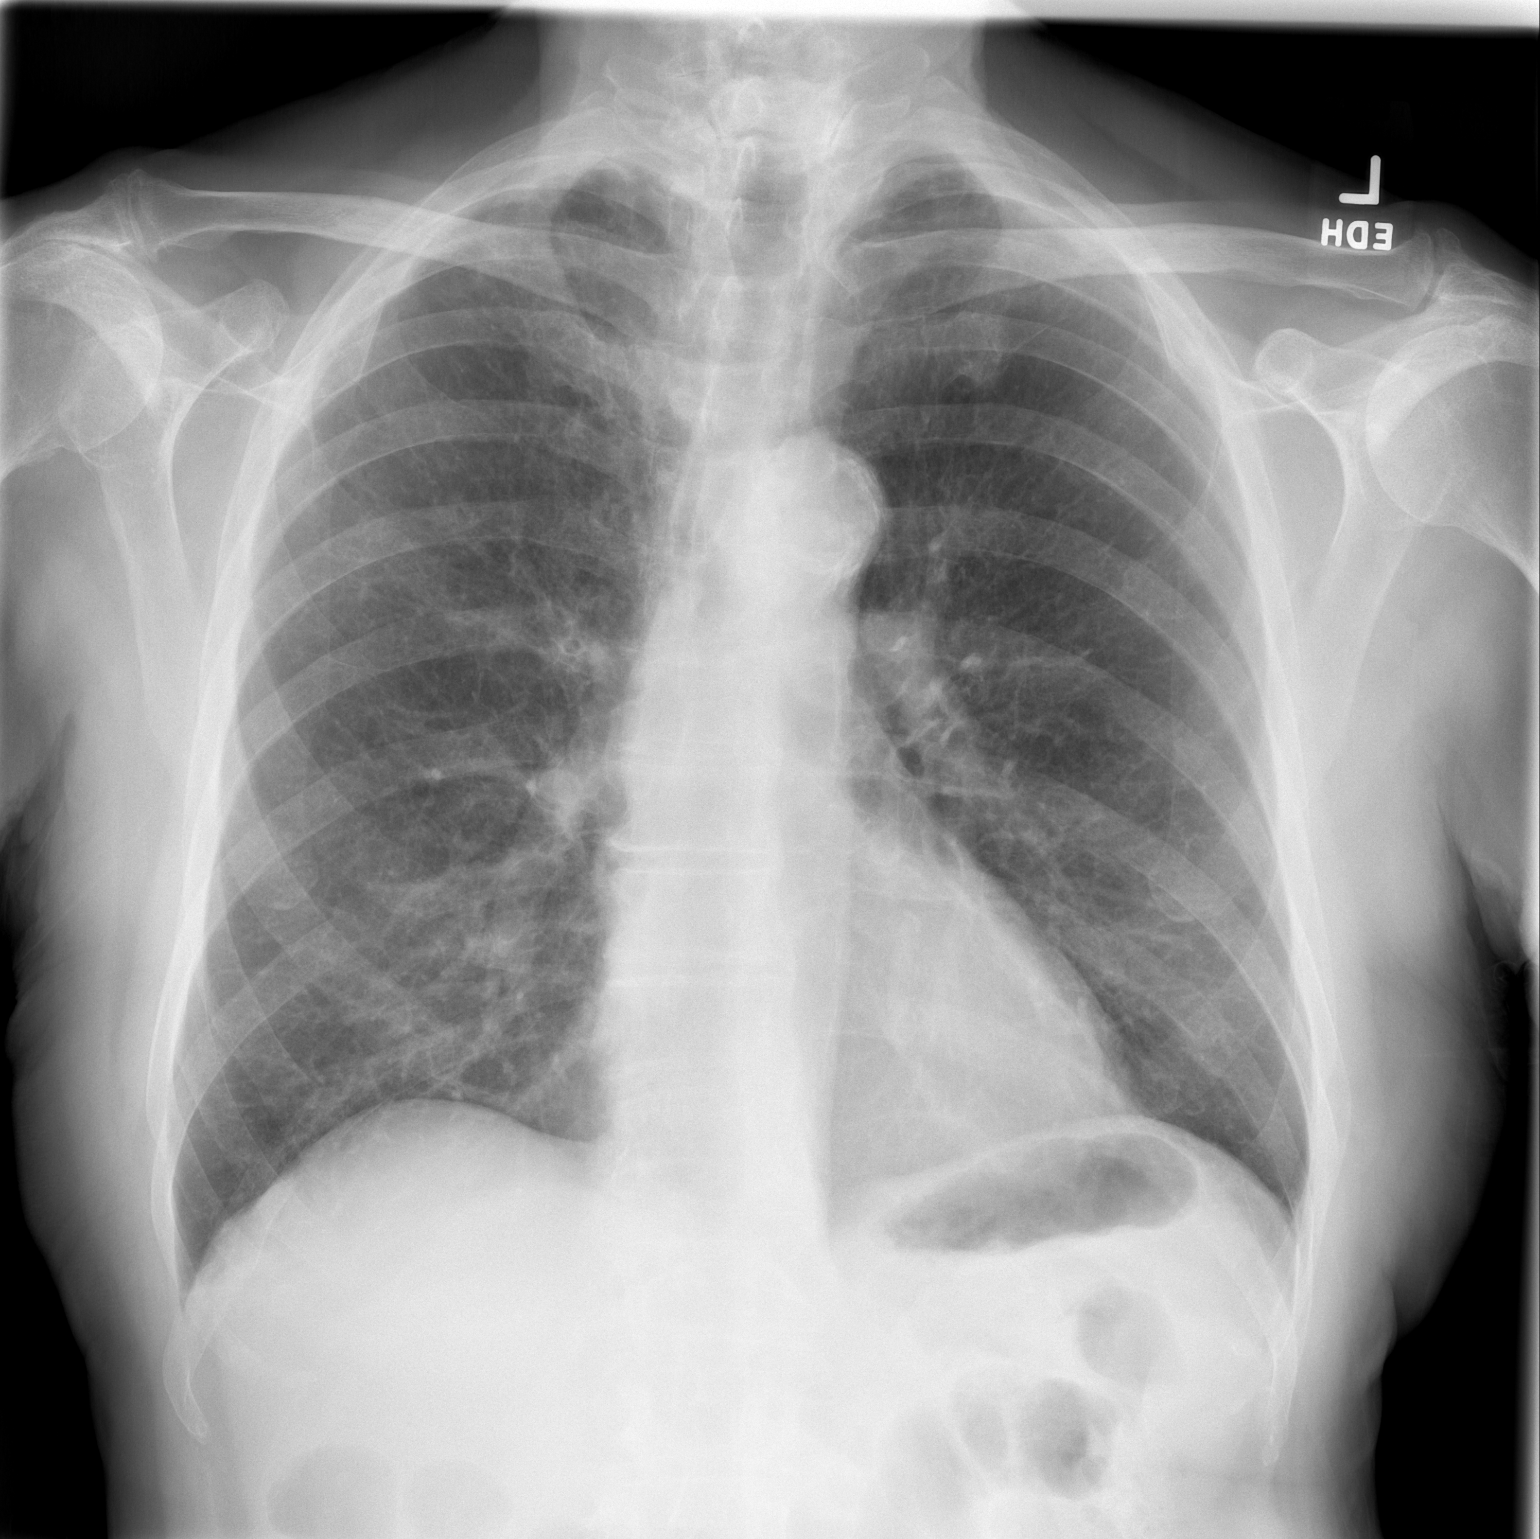

[w chest lat]
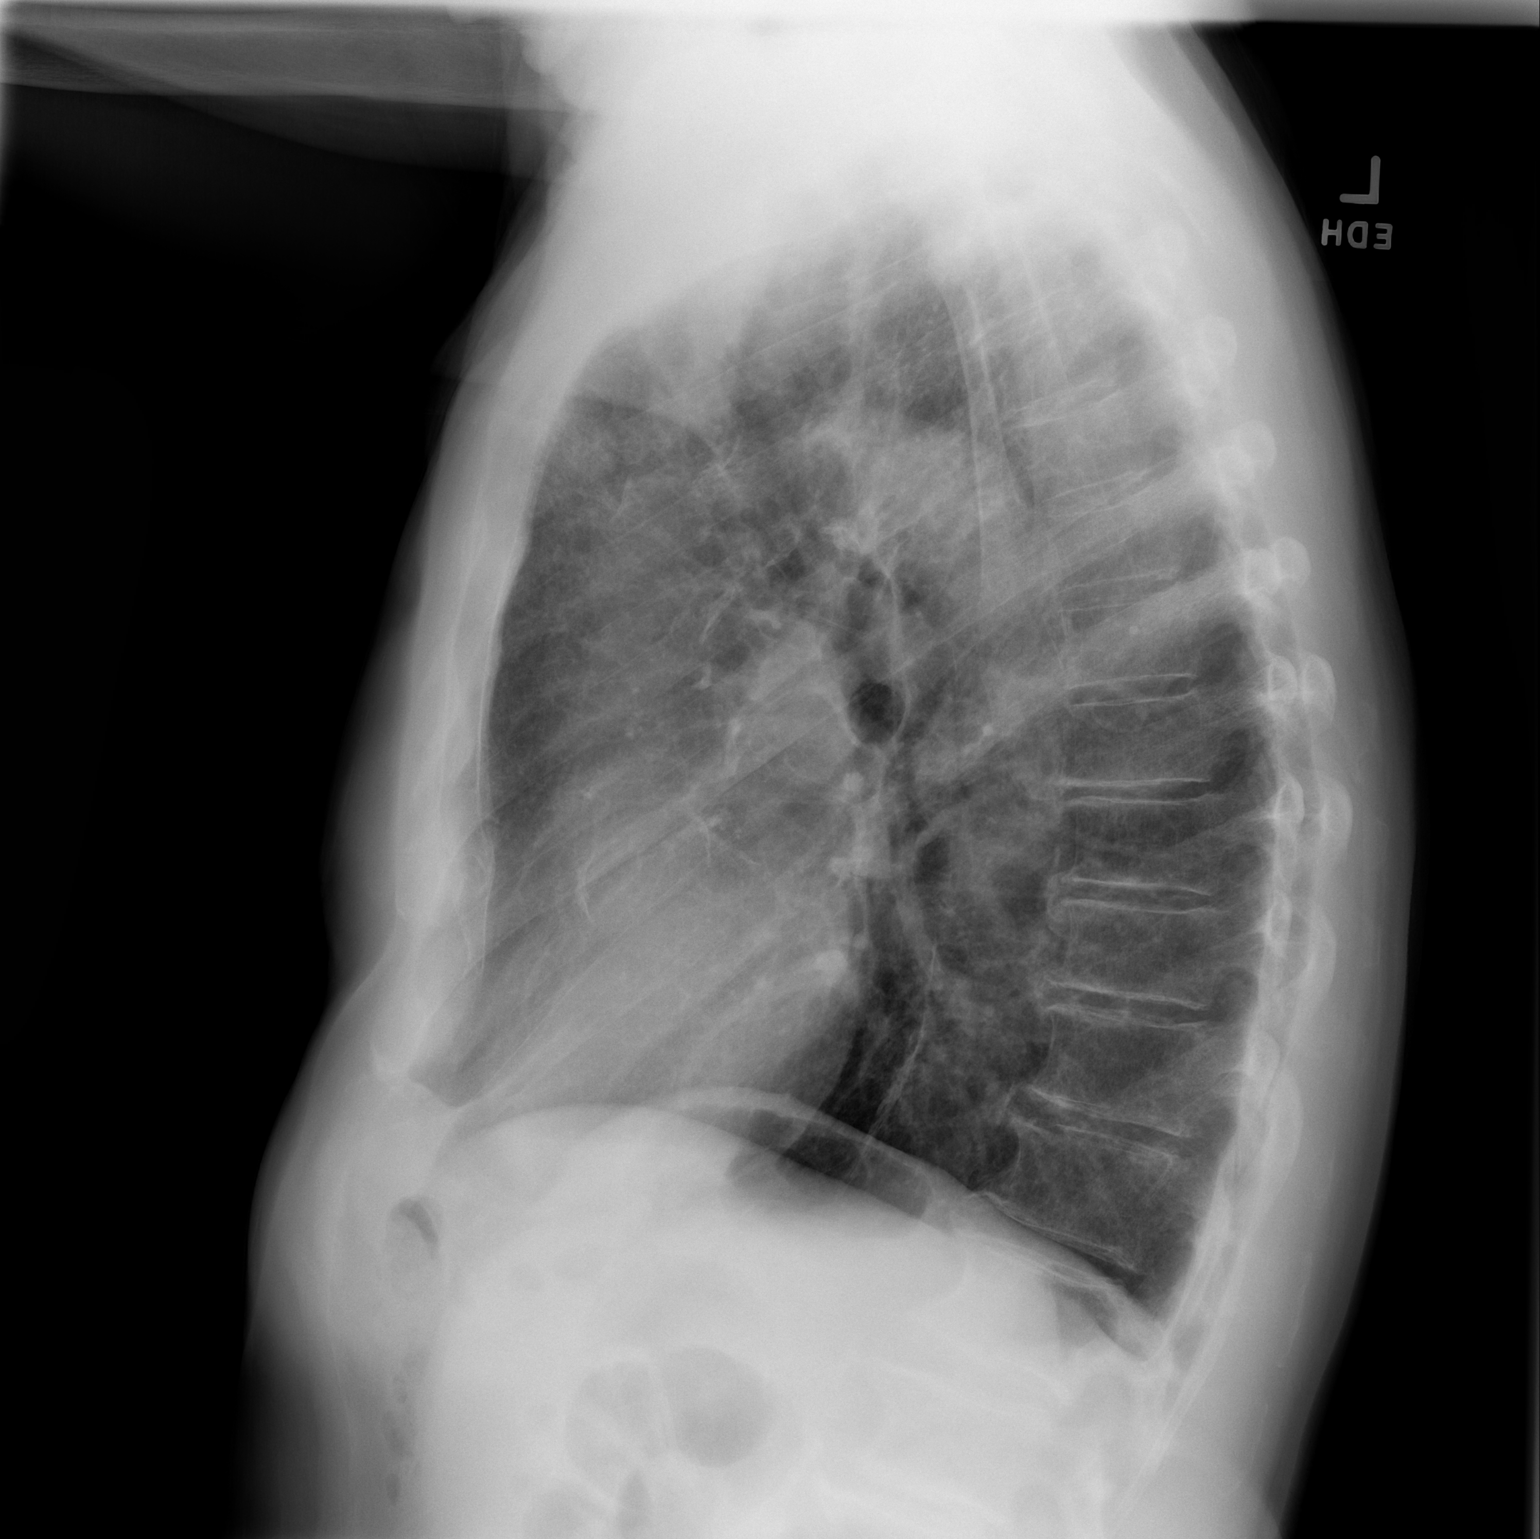

[2 of 2 positions shown; findings below may reference images not displayed]

FINDINGS: The heart, hila, and mediastinum are normal. No pneumothorax. No
nodules or masses are identified. No focal infiltrates.
IMPRESSION: No cause for weight loss identified.

## 2021-01-07 DIAGNOSIS — H52203 Unspecified astigmatism, bilateral: Secondary | ICD-10-CM | POA: Diagnosis not present

## 2021-01-07 DIAGNOSIS — H26492 Other secondary cataract, left eye: Secondary | ICD-10-CM | POA: Diagnosis not present

## 2021-01-07 DIAGNOSIS — H401133 Primary open-angle glaucoma, bilateral, severe stage: Secondary | ICD-10-CM | POA: Diagnosis not present

## 2021-01-07 DIAGNOSIS — H353132 Nonexudative age-related macular degeneration, bilateral, intermediate dry stage: Secondary | ICD-10-CM | POA: Diagnosis not present

## 2021-01-10 DIAGNOSIS — D3131 Benign neoplasm of right choroid: Secondary | ICD-10-CM | POA: Diagnosis not present

## 2021-01-10 DIAGNOSIS — H353132 Nonexudative age-related macular degeneration, bilateral, intermediate dry stage: Secondary | ICD-10-CM | POA: Diagnosis not present

## 2021-01-10 DIAGNOSIS — H35373 Puckering of macula, bilateral: Secondary | ICD-10-CM | POA: Diagnosis not present

## 2021-01-10 DIAGNOSIS — H35033 Hypertensive retinopathy, bilateral: Secondary | ICD-10-CM | POA: Diagnosis not present

## 2021-02-04 DIAGNOSIS — H35373 Puckering of macula, bilateral: Secondary | ICD-10-CM | POA: Diagnosis not present

## 2021-02-04 DIAGNOSIS — H35033 Hypertensive retinopathy, bilateral: Secondary | ICD-10-CM | POA: Diagnosis not present

## 2021-02-04 DIAGNOSIS — H353132 Nonexudative age-related macular degeneration, bilateral, intermediate dry stage: Secondary | ICD-10-CM | POA: Diagnosis not present

## 2021-02-04 DIAGNOSIS — D3131 Benign neoplasm of right choroid: Secondary | ICD-10-CM | POA: Diagnosis not present

## 2021-02-08 DIAGNOSIS — H26492 Other secondary cataract, left eye: Secondary | ICD-10-CM | POA: Diagnosis not present

## 2021-02-24 DIAGNOSIS — J449 Chronic obstructive pulmonary disease, unspecified: Secondary | ICD-10-CM | POA: Diagnosis not present

## 2021-02-24 DIAGNOSIS — Z8551 Personal history of malignant neoplasm of bladder: Secondary | ICD-10-CM | POA: Diagnosis not present

## 2021-02-24 DIAGNOSIS — I480 Paroxysmal atrial fibrillation: Secondary | ICD-10-CM | POA: Diagnosis not present

## 2021-02-24 DIAGNOSIS — F17200 Nicotine dependence, unspecified, uncomplicated: Secondary | ICD-10-CM | POA: Diagnosis not present

## 2021-02-24 DIAGNOSIS — K589 Irritable bowel syndrome without diarrhea: Secondary | ICD-10-CM | POA: Diagnosis not present

## 2021-02-24 DIAGNOSIS — N401 Enlarged prostate with lower urinary tract symptoms: Secondary | ICD-10-CM | POA: Diagnosis not present

## 2021-02-24 DIAGNOSIS — E7800 Pure hypercholesterolemia, unspecified: Secondary | ICD-10-CM | POA: Diagnosis not present

## 2021-02-24 DIAGNOSIS — I10 Essential (primary) hypertension: Secondary | ICD-10-CM | POA: Diagnosis not present

## 2021-02-24 DIAGNOSIS — Z0000 Encounter for general adult medical examination without abnormal findings: Secondary | ICD-10-CM | POA: Diagnosis not present

## 2021-02-24 DIAGNOSIS — I700 Atherosclerosis of aorta: Secondary | ICD-10-CM | POA: Diagnosis not present

## 2021-02-24 DIAGNOSIS — Z1389 Encounter for screening for other disorder: Secondary | ICD-10-CM | POA: Diagnosis not present

## 2021-02-25 DIAGNOSIS — H0279 Other degenerative disorders of eyelid and periocular area: Secondary | ICD-10-CM | POA: Diagnosis not present

## 2021-02-25 DIAGNOSIS — H57813 Brow ptosis, bilateral: Secondary | ICD-10-CM | POA: Diagnosis not present

## 2021-02-25 DIAGNOSIS — H53483 Generalized contraction of visual field, bilateral: Secondary | ICD-10-CM | POA: Diagnosis not present

## 2021-02-25 DIAGNOSIS — H02132 Senile ectropion of right lower eyelid: Secondary | ICD-10-CM | POA: Diagnosis not present

## 2021-02-25 DIAGNOSIS — H02831 Dermatochalasis of right upper eyelid: Secondary | ICD-10-CM | POA: Diagnosis not present

## 2021-02-25 DIAGNOSIS — H02413 Mechanical ptosis of bilateral eyelids: Secondary | ICD-10-CM | POA: Diagnosis not present

## 2021-02-25 DIAGNOSIS — H04123 Dry eye syndrome of bilateral lacrimal glands: Secondary | ICD-10-CM | POA: Diagnosis not present

## 2021-02-25 DIAGNOSIS — H02423 Myogenic ptosis of bilateral eyelids: Secondary | ICD-10-CM | POA: Diagnosis not present

## 2021-02-25 DIAGNOSIS — H02135 Senile ectropion of left lower eyelid: Secondary | ICD-10-CM | POA: Diagnosis not present

## 2021-02-25 DIAGNOSIS — H02834 Dermatochalasis of left upper eyelid: Secondary | ICD-10-CM | POA: Diagnosis not present

## 2021-03-23 DIAGNOSIS — H53483 Generalized contraction of visual field, bilateral: Secondary | ICD-10-CM | POA: Diagnosis not present

## 2021-03-30 ENCOUNTER — Other Ambulatory Visit: Payer: Self-pay

## 2021-03-30 DIAGNOSIS — R21 Rash and other nonspecific skin eruption: Secondary | ICD-10-CM

## 2021-03-30 DIAGNOSIS — L219 Seborrheic dermatitis, unspecified: Secondary | ICD-10-CM

## 2021-03-30 MED ORDER — TACROLIMUS 0.1 % EX OINT: TOPICAL_OINTMENT | CUTANEOUS | 1 refills | Status: AC

## 2021-03-30 MED ORDER — KETOCONAZOLE 2 % EX SHAM: MEDICATED_SHAMPOO | CUTANEOUS | 1 refills | Status: DC

## 2021-03-30 MED ORDER — FLUOCINONIDE 0.05 % EX SOLN: CUTANEOUS | 1 refills | Status: DC

## 2021-03-30 NOTE — Progress Notes (Signed)
90 supply request on medications from [facility].  ?

## 2021-04-01 DIAGNOSIS — H35373 Puckering of macula, bilateral: Secondary | ICD-10-CM | POA: Diagnosis not present

## 2021-04-01 DIAGNOSIS — H35033 Hypertensive retinopathy, bilateral: Secondary | ICD-10-CM | POA: Diagnosis not present

## 2021-04-01 DIAGNOSIS — H353132 Nonexudative age-related macular degeneration, bilateral, intermediate dry stage: Secondary | ICD-10-CM | POA: Diagnosis not present

## 2021-04-01 DIAGNOSIS — D3131 Benign neoplasm of right choroid: Secondary | ICD-10-CM | POA: Diagnosis not present

## 2021-04-06 DIAGNOSIS — J441 Chronic obstructive pulmonary disease with (acute) exacerbation: Secondary | ICD-10-CM | POA: Diagnosis not present

## 2021-05-11 ENCOUNTER — Other Ambulatory Visit: Payer: Self-pay | Admitting: Internal Medicine

## 2021-05-11 ENCOUNTER — Ambulatory Visit
Admission: RE | Admit: 2021-05-11 | Discharge: 2021-05-11 | Disposition: A | Discharge status: Home / Self Care | Payer: Medicare Other | Source: Ambulatory Visit | Attending: Internal Medicine | Admitting: Internal Medicine

## 2021-05-11 DIAGNOSIS — J449 Chronic obstructive pulmonary disease, unspecified: Secondary | ICD-10-CM | POA: Diagnosis not present

## 2021-05-11 DIAGNOSIS — J439 Emphysema, unspecified: Secondary | ICD-10-CM | POA: Diagnosis not present

## 2021-05-11 DIAGNOSIS — R61 Generalized hyperhidrosis: Secondary | ICD-10-CM | POA: Diagnosis not present

## 2021-05-11 DIAGNOSIS — R053 Chronic cough: Secondary | ICD-10-CM | POA: Diagnosis not present

## 2021-05-11 DIAGNOSIS — J3089 Other allergic rhinitis: Secondary | ICD-10-CM | POA: Diagnosis not present

## 2021-05-16 ENCOUNTER — Telehealth: Payer: Self-pay | Admitting: Internal Medicine

## 2021-05-16 NOTE — Telephone Encounter (Signed)
Patient is calling wanting to know if our office received a clearance request from [facility]. He reports he is needing to stop Eliquis for two weeks for this procedure and they requested clearance last week. Please advise.  ?

## 2021-05-16 NOTE — Telephone Encounter (Signed)
Do not see any clearances entered in the system. Will route to callback team to assist with obtaining clearance request info, then please route back to preop APP pool when available and we will review.  ?

## 2021-05-16 NOTE — Telephone Encounter (Signed)
Contacted the patient to get the name and number of doctor doing his procedure.  ? ?Left message with the [facility] requesting clearance information, left call back and fax number on their voicemail.  ?  ? ?

## 2021-05-17 NOTE — Telephone Encounter (Signed)
Will route to callback to recommend patient have CBC before move forward with setting up tele visit. Sooner is better so we can have adequate time to review clearance. ?

## 2021-05-17 NOTE — Telephone Encounter (Signed)
Will route to pharm then pt will need tele call. ?

## 2021-05-17 NOTE — Telephone Encounter (Signed)
Patient with diagnosis of atrial fibrillation on Eliquis for anticoagulation.   ? ?Procedure: B: upper eyelid blepharoplasty w/ mid forehead brow lift ?Date of procedure: 06/08/21 ? ? ?CHA2DS2-VASc Score = 3  ? This indicates a 3.2% annual risk of stroke. ?The patient's score is based upon: ?CHF History: 0 ?HTN History: 1 ?Diabetes History: 0 ?Stroke History: 0 ?Vascular Disease History: 0 ?Age Score: 2 ?Gender Score: 0 ?  ?CrCl 56 ?No platelet count since 2021, will need to repeat for final clearance ? ? ? ?

## 2021-05-17 NOTE — Telephone Encounter (Signed)
? ?  Pre-operative Risk Assessment  ?  ?Patient Name: Keith Giles  ?DOB: Dec 12, 1933 ?MRN: [id number]  ? ?  ? ?Request for Surgical Clearance   ? ?Procedure:   B/L UPPER EYELID BLEPHAROPLASTY  WITH MID FOREHEAD BROW LIFT  ? ?Date of Surgery:  Clearance 05/30/21                              ?   ?Surgeon:  DR. Isidoro Donning ?Surgeon's Group or Practice Name:  [facility]  ?Phone number:  [phone] ?Fax number:  [phone] ?  ?Type of Clearance Requested:   ?- Medical  ?- Pharmacy:  Hold Apixaban (Eliquis)   ?  ?Type of Anesthesia:  MAC ?  ?Additional requests/questions:   ? ?Signed, ?Julaine Hua   ?05/17/2021, 12:31 PM  ? ?

## 2021-05-18 ENCOUNTER — Telehealth: Payer: Self-pay | Admitting: *Deleted

## 2021-05-18 NOTE — Telephone Encounter (Signed)
Pt agreeable to plan of care for tele pre op appt 05/20/21 @ 9 am ?Med rec and consent are done. Pt states he just had labs done with PCP and that they took a lot of blood per the pt. I will call PCP to see if CBC was done and have results faxed to our office.  ? ?I s/w PCP office and have CBC results being faxed to our office . I did obtain some of the values we will need while waiting for the fax. Once the results received in office we will scan into the chart.  ? ?CBC RESULTS:  ?WBC-9.5 ?RBC-4.50 ?HGB-14.2 ?HCT-42.1 ?PLT 235 ? ?Results have been received; I will have scanned into the chart by HIM Dept ? ?  ?Patient Consent for Virtual Visit  ? ? ?   ? ?[name] has provided verbal consent on 05/18/2021 for a virtual visit (video or telephone). ? ? ?CONSENT FOR VIRTUAL VISIT FOR:  Keith Giles  ?By participating in this virtual visit I agree to the following: ? ?I hereby voluntarily request, consent and authorize [facility] and its employed or contracted physicians, physician assistants, nurse practitioners or other licensed health care professionals (the Practitioner), to provide me with telemedicine health care services (the ?Services") as deemed necessary by the treating Practitioner. I acknowledge and consent to receive the Services by the Practitioner via telemedicine. I understand that the telemedicine visit will involve communicating with the Practitioner through live audiovisual communication technology and the disclosure of certain medical information by electronic transmission. I acknowledge that I have been given the opportunity to request an in-person assessment or other available alternative prior to the telemedicine visit and am voluntarily participating in the telemedicine visit. ? ?I understand that I have the right to withhold or withdraw my consent to the use of telemedicine in the course of my care at any time, without affecting my right to future care or treatment, and that the  Practitioner or I may terminate the telemedicine visit at any time. I understand that I have the right to inspect all information obtained and/or recorded in the course of the telemedicine visit and may receive copies of available information for a reasonable fee.  I understand that some of the potential risks of receiving the Services via telemedicine include:  ?Delay or interruption in medical evaluation due to technological equipment failure or disruption; ?Information transmitted may not be sufficient (e.g. poor resolution of images) to allow for appropriate medical decision making by the Practitioner; and/or  ?In rare instances, security protocols could fail, causing a breach of personal health information. ? ?Furthermore, I acknowledge that it is my responsibility to provide information about my medical history, conditions and care that is complete and accurate to the best of my ability. I acknowledge that Practitioner's advice, recommendations, and/or decision may be based on factors not within their control, such as incomplete or inaccurate data provided by me or distortions of diagnostic images or specimens that may result from electronic transmissions. I understand that the practice of medicine is not an exact science and that Practitioner makes no warranties or guarantees regarding treatment outcomes. I acknowledge that a copy of this consent can be made available to me via my patient portal ([facility]), or I can request a printed copy by calling the office of [facility].   ? ?I understand that my insurance will be billed for this visit.  ? ?I have read or had this consent read to  me. ?I understand the contents of this consent, which adequately explains the benefits and risks of the Services being provided via telemedicine.  ?I have been provided ample opportunity to ask questions regarding this consent and the Services and have had my questions answered to my satisfaction. ?I give my  informed consent for the services to be provided through the use of telemedicine in my medical care ? ? ? ?

## 2021-05-18 NOTE — Telephone Encounter (Signed)
Pt agreeable to plan of care for tele pre op appt 05/20/21 @ 9 am ?Med rec and consent are done. Pt states he just had labs done with PCP and that they took a lot of blood per the pt. I will call PCP to see if CBC was done and have results faxed to our office.  ?  ?I s/w PCP office and have CBC results being faxed to our office . I did obtain some of the values we will need while waiting for the fax. Once the results received in office we will scan into the chart.  ?  ?CBC RESULTS:  ?WBC-9.5 ?RBC-4.50 ?HGB-14.2 ?HCT-42.1 ?PLT 235 ?  ?Results have been received; I will have scanned into the chart by HIM Dept ?

## 2021-05-18 NOTE — Telephone Encounter (Signed)
CBC stable. Prior CHADS2VASc score calculated at 3. Ok to hold Eliquis for 1-2 days prior to procedure. ?

## 2021-05-18 NOTE — Telephone Encounter (Signed)
Pt agreeable to plan of care for tele pre op appt 05/20/21 @ 9 am ?Med rec and consent are done. Pt states he just had labs done with PCP and that they took a lot of blood per the pt. I will call PCP to see if CBC was done and have results faxed to our office.  ?

## 2021-05-19 NOTE — Telephone Encounter (Signed)
Pt has tele visit scheduled 4/21, will remove from preop box. ?

## 2021-05-20 ENCOUNTER — Encounter: Payer: Self-pay | Admitting: General Practice

## 2021-05-20 ENCOUNTER — Ambulatory Visit (INDEPENDENT_AMBULATORY_CARE_PROVIDER_SITE_OTHER): Payer: Medicare Other | Admitting: General Practice

## 2021-05-20 DIAGNOSIS — Z01810 Encounter for preprocedural cardiovascular examination: Secondary | ICD-10-CM

## 2021-05-20 NOTE — Progress Notes (Signed)
? ?Virtual Visit via Telephone Note  ? ?This visit type was conducted due to national recommendations for restrictions regarding the COVID-19 Pandemic (e.g. social distancing) in an effort to limit this patient's exposure and mitigate transmission in our community.  Due to his co-morbid illnesses, this patient is at least at moderate risk for complications without adequate follow up.  This format is felt to be most appropriate for this patient at this time.  The patient did not have access to video technology/had technical difficulties with video requiring transitioning to audio format only (telephone).  All issues noted in this document were discussed and addressed.  No physical exam could be performed with this format.  Please refer to the patient's chart for his  consent to telehealth for [facility]. ? ?Evaluation Performed:  Preoperative cardiovascular risk assessment ?_____________  ? ?Date:  05/20/2021  ? ?Patient ID:  Keith Giles, Keith Giles 25-Feb-1933, MRN [id number] ?Patient Location:  ?Home ?Provider location:   ?Office ? ?Primary Care Provider:  Lavone Orn, MD ?Primary Cardiologist:  Cristopher Peru, MD ? ?Chief Complaint  ?  ?88 y.o. y/o male with a h/o BPH, COPD, hyperlipidemia, hypertension, atrial fibrillation, who is pending bilateral upper eyelid blepharoplasty with mid forehead brow lift, and presents today for telephonic preoperative cardiovascular risk assessment. ? ?Past Medical History  ?  ?Past Medical History:  ?Diagnosis Date  ? Arthritis   ? Bladder stone   ? Bladder tumor   ? BPH (benign prostatic hyperplasia)   ? Chronic asthmatic bronchitis ([place])   ? COPD (chronic obstructive pulmonary disease) ([place])   ? Diverticulosis of colon   ? Full dentures   ? History of adenomatous polyp of colon   ? 2002 HYPERPLASTIC  ? History of TIA (transient ischemic attack)   ? PER PT STATES HAD TIA SEVERAL YRS NO RESIDUAL AND DID NOT SEEK MEDICAL ATTENTION  ? Hyperlipidemia   ? Hypertension   ? IBS  (irritable bowel syndrome)   ? Microhematuria   ? Milk intolerance   ? Right inguinal hernia   ? Shortness of breath   ? Urgency of urination   ? Wears glasses   ? Wears hearing aid   ? BILATERAL  ? ?Past Surgical History:  ?Procedure Laterality Date  ? CARDIAC CATHETERIZATION  2000  ? luminal irregularities LAD, CFX, and RCA  no high grade obstruction  ? CARDIOVASCULAR STRESS TEST  02-06-2002  ? fixed mild thinning of the inferior wall w/ no reversible defects identified, no ischemia/  normal LV function and wall motion, ef 69%  ? COLONOSCOPY N/A 02/11/2013  ? Procedure: COLONOSCOPY;  Surgeon: Garlan Fair, MD;  Location: [facility];  Service: Endoscopy;  Laterality: N/A;  ? CYSTOSCOPY W/ RETROGRADES Left 06/09/2015  ? Procedure: CYSTOSCOPY WITH RETROGRADE PYELOGRAM;  Surgeon: Rana Snare, MD;  Location: [facility];  Service: Urology;  Laterality: Left;  ? CYSTOSCOPY WITH URETHRAL DILATATION N/A 06/09/2015  ? Procedure: CYSTOSCOPY WITH URETHRAL DILATATION;  Surgeon: Rana Snare, MD;  Location: [facility];  Service: Urology;  Laterality: N/A;  ? HYDROCELE EXCISION Left 01-03-2006  ? INGUINAL HERNIA REPAIR Left 1961  ? TONSILLECTOMY    ? TRANSURETHRAL RESECTION OF BLADDER TUMOR N/A 06/09/2015  ? Procedure:  TRANSURETHRAL RESECTION OF BLADDER TUMOR (TURBT);  Surgeon: Rana Snare, MD;  Location: [facility];  Service: Urology;  Laterality: N/A;  ? URETEROSCOPY Left 06/09/2015  ? Procedure: URETEROSCOPY/ STONE EXTRACTION BLADDER;  Surgeon: Rana Snare, MD;  Location:  [facility];  Service: Urology;  Laterality: Left;  ? ? ?Allergies ? ?Allergies  ?Allergen Reactions  ? Antihistamines, Diphenhydramine-Type Other (See Comments)  ?  Difficulty  voiding  ? Sulfa Antibiotics Rash  ? ? ?History of Present Illness  ?  ?Keith Giles is a 88 y.o. male who presents via audio/video conferencing for a telehealth visit today.  Pt was last seen in cardiology clinic  on 12/28/2020 by Dr. [name].  At that time Keith Giles was doing well .  The patient is now pending bilateral upper eyelid blepharoplasty with mid forehead brow lift.  Since his last visit, he remains stable from a cardiac standpoint. ? ?Today he denies chest pain, shortness of breath, lower extremity edema, fatigue, palpitations, melena, hematuria, hemoptysis, diaphoresis, weakness, presyncope, syncope, orthopnea, and PND. ? ? ? ?Home Medications  ?  ?Prior to Admission medications   ?Medication Sig Start Date End Date Taking? Authorizing Provider  ?albuterol (VENTOLIN HFA) 108 (90 Base) MCG/ACT inhaler Take 1 puff by mouth as needed. PRN AS NEEDED FOR WHEEZING PER PT    [provider]  ?ANORO ELLIPTA 62.5-25 MCG/INH AEPB Inhale 1 puff into the lungs daily. 06/19/19   [provider]  ?atorvastatin (LIPITOR) 20 MG tablet Take 20 mg by mouth every evening.     [provider]  ?clobetasol (TEMOVATE) 0.05 % external solution Patient to mix solution in jar of CeraVe Cream and apply twice daily to arms and legs until rash improved. 01/04/21   Brendolyn Patty, MD  ?Arne [place] 5 MG TABS tablet TAKE 1 TABLET TWICE A DAY 08/30/20   Shirley Friar, PA-C  ?finasteride (PROSCAR) 5 MG tablet Take 1 tablet by mouth daily. 06/23/17   [provider]  ?fluocinonide (LIDEX) 0.05 % external solution Apply to affected areas scalp 1-2 times a day as needed for itch. Avoid face, groin, axilla. 03/30/21   Brendolyn Patty, MD  ?ketoconazole (NIZORAL) 2 % shampoo Apply to scalp 2-3 times a week and let sit several minutes before rinsing. 03/30/21   Brendolyn Patty, MD  ?latanoprost (XALATAN) 0.005 % ophthalmic solution Place 1 drop into both eyes daily. 05/09/17   [provider]  ?metoprolol succinate (TOPROL XL) 25 MG 24 hr tablet Take 1 tablet (25 mg total) by mouth daily. 12/28/20   Evans Lance, MD  ?tacrolimus (PROTOPIC) 0.1 % ointment Apply to rash on arms and legs once a day until  improved. (Apply after clobetasol/CeraVe mix) 03/30/21   Brendolyn Patty, MD  ?tadalafil (CIALIS) 20 MG tablet  06/05/19   [provider]  ?tamsulosin (FLOMAX) 0.4 MG CAPS capsule Take 0.4 mg by mouth daily.    [provider]  ? ? ?Physical Exam  ?  ?Vital Signs:  Keith Giles does not have vital signs available for review today. ? ?Given telephonic nature of communication, physical exam is limited. ?AAOx3. NAD. Normal affect.  Speech and respirations are unlabored. ? ?Accessory Clinical Findings  ?  ?None ? ?Assessment & Plan  ?  ?1.  Preoperative Cardiovascular Risk Assessment: ? ? ? ?Primary Cardiologist: Cristopher Peru, MD ? ?Chart reviewed as part of pre-operative protocol coverage. Given past medical history and time since last visit, based on ACC/AHA guidelines, Keith Giles would be at acceptable risk for the planned procedure without further cardiovascular testing.  ? ?Patient was advised that if he develops new symptoms prior to surgery to contact our office to arrange a follow-up appointment.  He  verbalized understanding. ? ?Patient with diagnosis of atrial fibrillation on Eliquis for anticoagulation.   ?  ?His RCRI is a class II risk, 0.9% risk of major cardiac event. ? ?Procedure:  upper eyelid blepharoplasty w/ mid forehead brow lift ?Date of procedure: 06/08/21 ?  ?  ?CHA2DS2-VASc Score = 3  ? This indicates a 3.2% annual risk of stroke. ?The patient's score is based upon: ?CHF History: 0 ?HTN History: 1 ?Diabetes History: 0 ?Stroke History: 0 ?Vascular Disease History: 0 ?Age Score: 2 ?Gender Score: 0 ?  ?CrCl 56 ? ?His Eliquis may be held for 1-2 days prior to his procedure. ? ? ?A copy of this note will be routed to requesting surgeon. ? ?Time:   ?Today, I have spent 8 minutes with the patient with telehealth technology discussing medical history, symptoms, and management plan.  Prior to contacting patient I spent greater than 10 minutes reviewing patient's medications and past  medical history. ? ? ?Deberah Pelton, NP ? ?05/20/2021, 8:20 AM ? ?

## 2021-05-30 DIAGNOSIS — H02132 Senile ectropion of right lower eyelid: Secondary | ICD-10-CM | POA: Diagnosis not present

## 2021-05-30 DIAGNOSIS — H57813 Brow ptosis, bilateral: Secondary | ICD-10-CM | POA: Diagnosis not present

## 2021-05-30 DIAGNOSIS — H53453 Other localized visual field defect, bilateral: Secondary | ICD-10-CM | POA: Diagnosis not present

## 2021-05-30 DIAGNOSIS — H02834 Dermatochalasis of left upper eyelid: Secondary | ICD-10-CM | POA: Diagnosis not present

## 2021-05-30 DIAGNOSIS — H02135 Senile ectropion of left lower eyelid: Secondary | ICD-10-CM | POA: Diagnosis not present

## 2021-05-30 DIAGNOSIS — H02831 Dermatochalasis of right upper eyelid: Secondary | ICD-10-CM | POA: Diagnosis not present

## 2021-05-30 DIAGNOSIS — H0279 Other degenerative disorders of eyelid and periocular area: Secondary | ICD-10-CM | POA: Diagnosis not present

## 2021-05-30 DIAGNOSIS — H02413 Mechanical ptosis of bilateral eyelids: Secondary | ICD-10-CM | POA: Diagnosis not present

## 2021-05-30 DIAGNOSIS — H53483 Generalized contraction of visual field, bilateral: Secondary | ICD-10-CM | POA: Diagnosis not present

## 2021-05-30 DIAGNOSIS — H02423 Myogenic ptosis of bilateral eyelids: Secondary | ICD-10-CM | POA: Diagnosis not present

## 2021-05-31 ENCOUNTER — Telehealth: Payer: Self-pay | Admitting: Internal Medicine

## 2021-05-31 NOTE — Telephone Encounter (Signed)
Typically recommend resuming anticoagulation within 24 hours after low bleed risk procedure. Agree surgeon should have advised him when to resume - they would have more detailed info on if there were any bleeding complications during the procedure, etc that could affect when he'd restart Eliquis. ?

## 2021-05-31 NOTE — Telephone Encounter (Addendum)
Patient had bilateral upper eyelid blepharoplasty with mid forehead brow lift .Typically he surgeon would give discharge instructions. I will forward to Pharm-D. ? ? ?I spoke with patient.States he was told to contact cardiology. He states he bleeding from incision above eyebrow after he removed dressing and had gush of blood come out and it bleed for approximately 15 minutes.He applied frozen peas to site. No bleeding since. His discharge instructions told him to hold NSAIDS for 5 days. I advised him to contact surgeon to see if he needs to be seen as his f/u with them is in 10 days. ?

## 2021-05-31 NOTE — Telephone Encounter (Signed)
Since patient had 15 minute episode of bleeding from surgical incision, I will forward to the cardiologist for advice.I also discussed this with PharmD and they advise MD input. ?

## 2021-05-31 NOTE — Telephone Encounter (Signed)
Pt wants to know when can he start back taking Eliquis. Pt has to stop taking due to a procedure that he had on yesterday. Please advise ?

## 2021-06-02 NOTE — Telephone Encounter (Addendum)
Called patient to get update from phone call.Left message to return call. ?

## 2021-08-08 DIAGNOSIS — H401133 Primary open-angle glaucoma, bilateral, severe stage: Secondary | ICD-10-CM | POA: Diagnosis not present

## 2021-08-08 DIAGNOSIS — H353132 Nonexudative age-related macular degeneration, bilateral, intermediate dry stage: Secondary | ICD-10-CM | POA: Diagnosis not present

## 2021-08-19 ENCOUNTER — Other Ambulatory Visit: Payer: Self-pay | Admitting: Student

## 2021-08-19 DIAGNOSIS — I480 Paroxysmal atrial fibrillation: Secondary | ICD-10-CM

## 2021-08-19 NOTE — Telephone Encounter (Signed)
Eliquis '5mg'$  refill request received. Patient is 88 years old, weight-75.1kg, Crea-1.04 on 02/22/2021 via [facility] from [place], [place], and last seen by Coletta Memos on 05/11/2021. Dose is appropriate based on dosing criteria. Will send in refill to requested pharmacy.

## 2021-08-29 DIAGNOSIS — I480 Paroxysmal atrial fibrillation: Secondary | ICD-10-CM | POA: Diagnosis not present

## 2021-08-29 DIAGNOSIS — K589 Irritable bowel syndrome without diarrhea: Secondary | ICD-10-CM | POA: Diagnosis not present

## 2021-08-29 DIAGNOSIS — K529 Noninfective gastroenteritis and colitis, unspecified: Secondary | ICD-10-CM | POA: Diagnosis not present

## 2021-08-29 DIAGNOSIS — N401 Enlarged prostate with lower urinary tract symptoms: Secondary | ICD-10-CM | POA: Diagnosis not present

## 2021-08-29 DIAGNOSIS — J449 Chronic obstructive pulmonary disease, unspecified: Secondary | ICD-10-CM | POA: Diagnosis not present

## 2021-08-29 DIAGNOSIS — I10 Essential (primary) hypertension: Secondary | ICD-10-CM | POA: Diagnosis not present

## 2021-09-14 DIAGNOSIS — K529 Noninfective gastroenteritis and colitis, unspecified: Secondary | ICD-10-CM | POA: Diagnosis not present

## 2021-09-22 DIAGNOSIS — E538 Deficiency of other specified B group vitamins: Secondary | ICD-10-CM | POA: Diagnosis not present

## 2021-09-22 DIAGNOSIS — K8681 Exocrine pancreatic insufficiency: Secondary | ICD-10-CM | POA: Diagnosis not present

## 2021-09-22 DIAGNOSIS — I480 Paroxysmal atrial fibrillation: Secondary | ICD-10-CM | POA: Diagnosis not present

## 2021-09-29 DIAGNOSIS — H02423 Myogenic ptosis of bilateral eyelids: Secondary | ICD-10-CM | POA: Diagnosis not present

## 2021-09-29 DIAGNOSIS — H04123 Dry eye syndrome of bilateral lacrimal glands: Secondary | ICD-10-CM | POA: Diagnosis not present

## 2021-09-29 DIAGNOSIS — Z09 Encounter for follow-up examination after completed treatment for conditions other than malignant neoplasm: Secondary | ICD-10-CM | POA: Diagnosis not present

## 2021-10-14 DIAGNOSIS — D3131 Benign neoplasm of right choroid: Secondary | ICD-10-CM | POA: Diagnosis not present

## 2021-10-14 DIAGNOSIS — H35373 Puckering of macula, bilateral: Secondary | ICD-10-CM | POA: Diagnosis not present

## 2021-10-14 DIAGNOSIS — H35033 Hypertensive retinopathy, bilateral: Secondary | ICD-10-CM | POA: Diagnosis not present

## 2021-10-14 DIAGNOSIS — H353132 Nonexudative age-related macular degeneration, bilateral, intermediate dry stage: Secondary | ICD-10-CM | POA: Diagnosis not present

## 2021-10-17 DIAGNOSIS — K8689 Other specified diseases of pancreas: Secondary | ICD-10-CM | POA: Diagnosis not present

## 2021-11-12 DIAGNOSIS — Z23 Encounter for immunization: Secondary | ICD-10-CM | POA: Diagnosis not present

## 2021-11-14 DIAGNOSIS — R3912 Poor urinary stream: Secondary | ICD-10-CM | POA: Diagnosis not present

## 2021-11-14 DIAGNOSIS — C674 Malignant neoplasm of posterior wall of bladder: Secondary | ICD-10-CM | POA: Diagnosis not present

## 2021-11-22 ENCOUNTER — Encounter: Payer: Medicare Other | Admitting: Dermatology

## 2021-11-26 IMAGING — CR DG CHEST 2V
1 series · 2 of 2 positions shown · non-contrast
Comparison: Chest x-ray dated August 21, 2018.

CLINICAL DATA: Left arm pain and tingling.

EXAM:
CHEST - 2 VIEW

[Series 1: dg chest 2 view · 0.14mm/px · 2 of 2 slices shown]
[im 1/2]
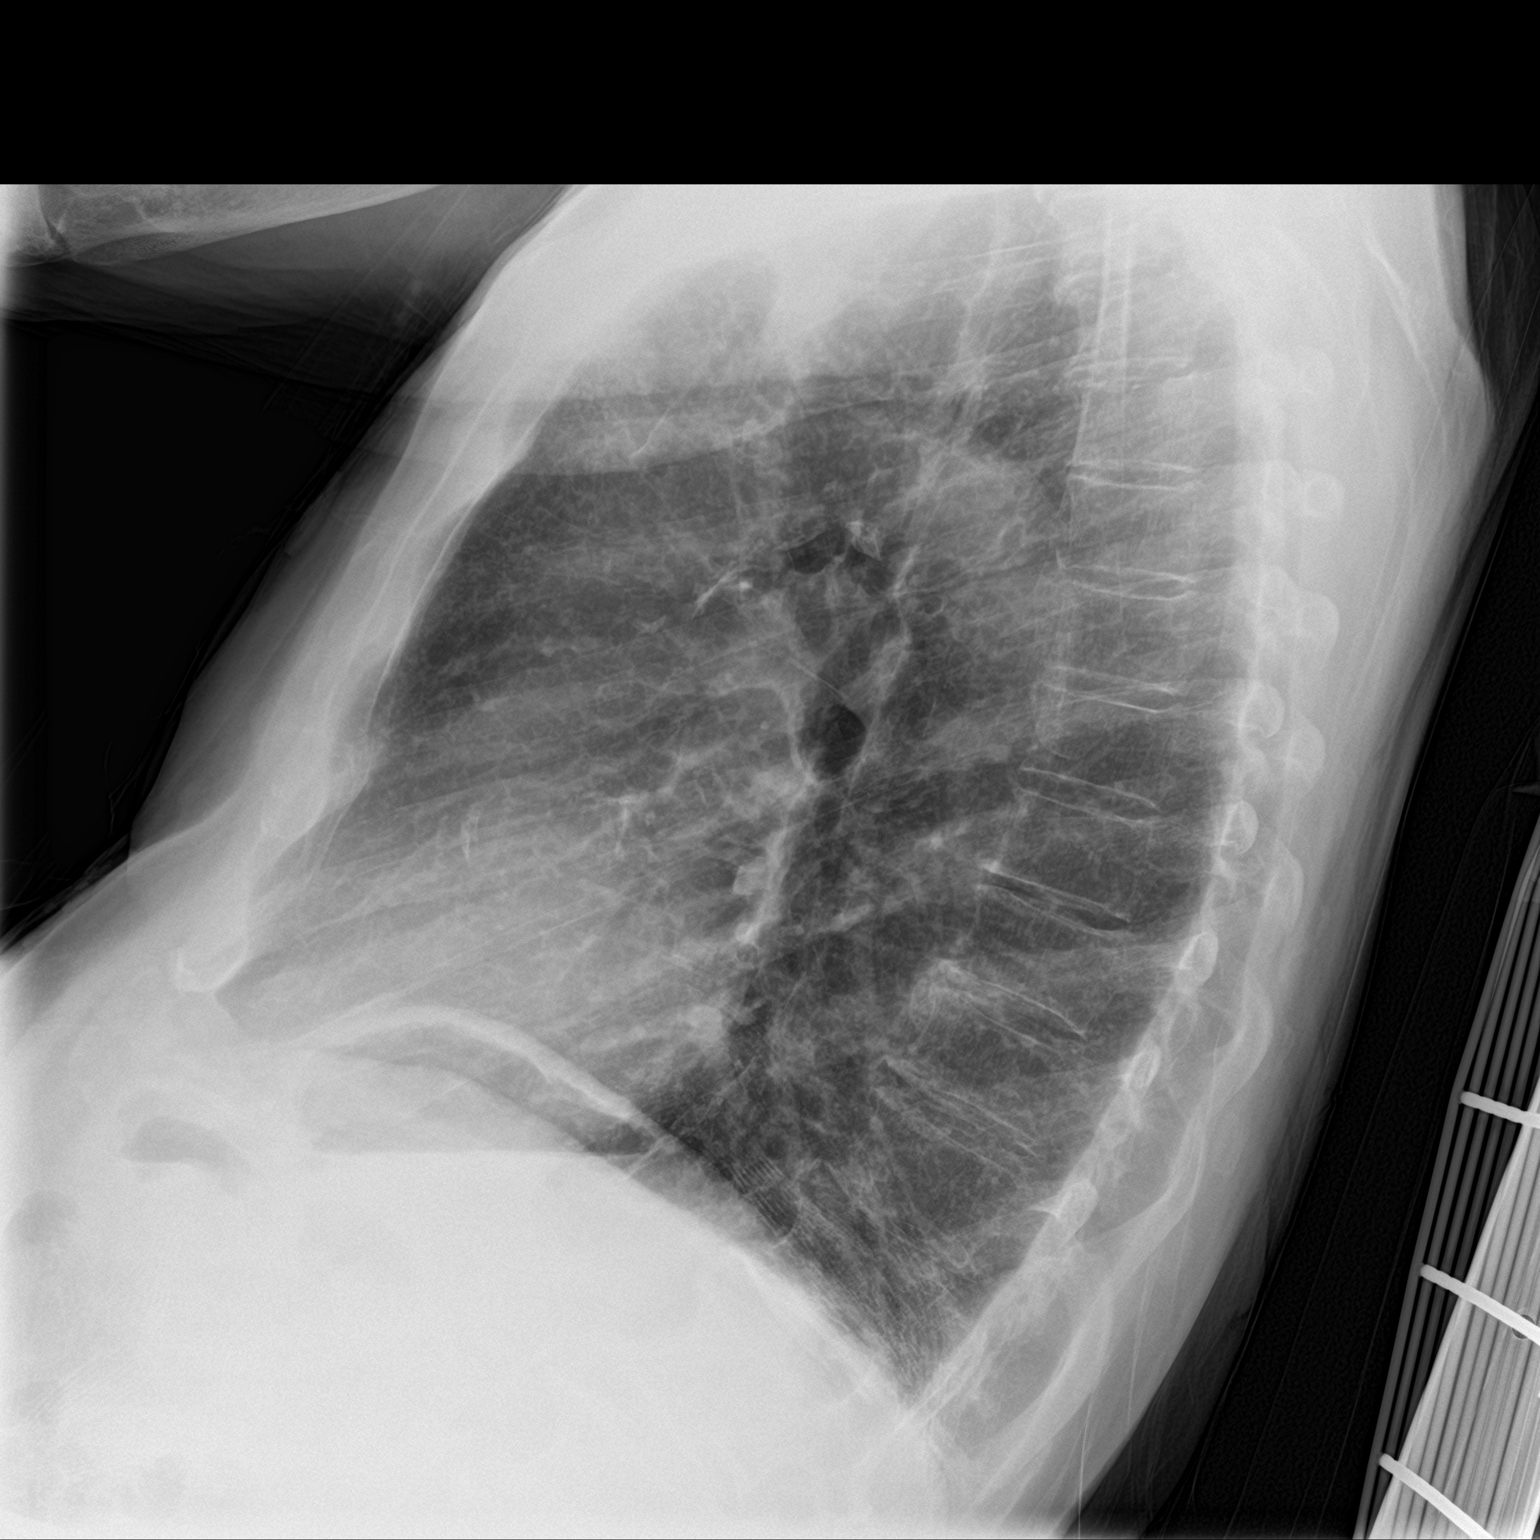
[im 2/2]
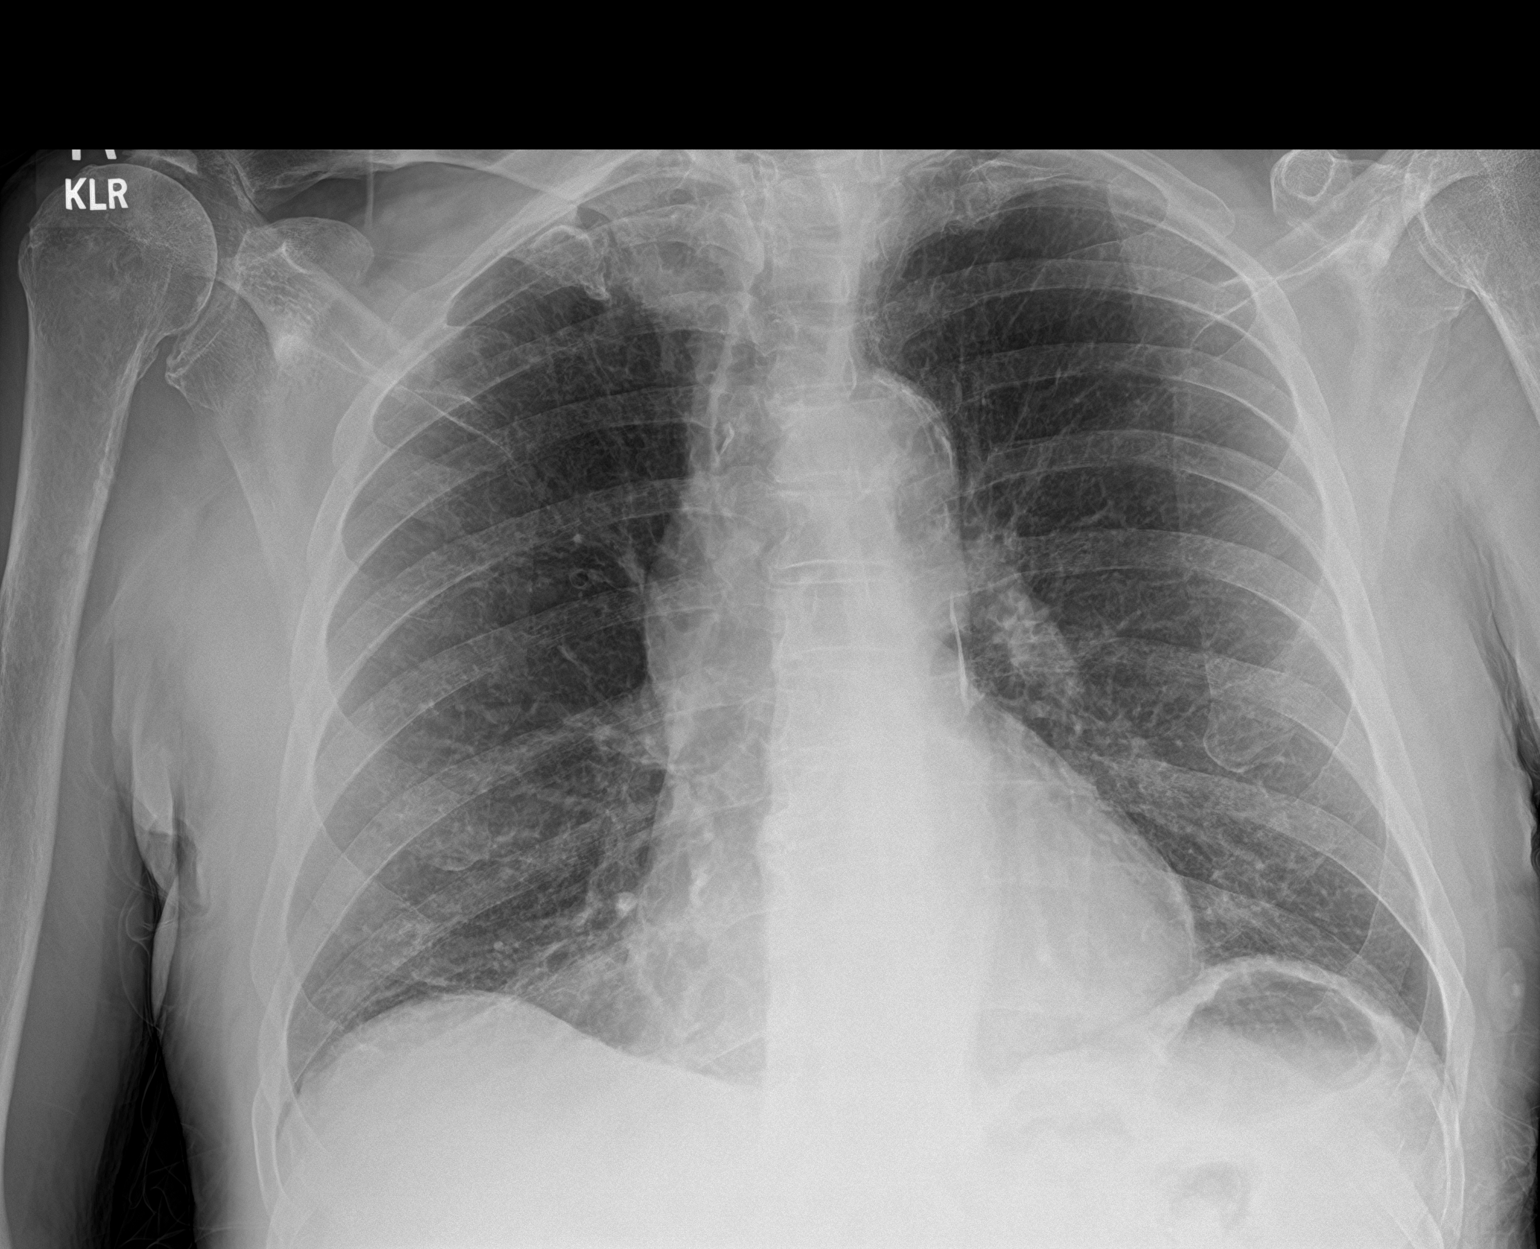

[2 of 2 positions shown; findings below may reference images not displayed]

FINDINGS: The heart size and mediastinal contours are within normal limits.
Atherosclerotic calcification of the aortic arch. Normal pulmonary
vascularity. Lordotic position with asymmetric increased density in
the right lung apex. No focal consolidation, pleural effusion, or
pneumothorax. No acute osseous abnormality.
IMPRESSION: 1. No acute cardiopulmonary disease.
2. Asymmetric increased density in the right lung apex may be
related to lordotic positioning, but non-emergent non-contrast chest
CT is recommended to exclude a mass.

## 2021-12-15 ENCOUNTER — Other Ambulatory Visit: Payer: Self-pay | Admitting: Internal Medicine

## 2021-12-26 ENCOUNTER — Ambulatory Visit (INDEPENDENT_AMBULATORY_CARE_PROVIDER_SITE_OTHER): Payer: Medicare Other | Admitting: Dermatology

## 2021-12-26 ENCOUNTER — Encounter: Payer: Self-pay | Admitting: Dermatology

## 2021-12-26 DIAGNOSIS — L814 Other melanin hyperpigmentation: Secondary | ICD-10-CM | POA: Diagnosis not present

## 2021-12-26 DIAGNOSIS — D2262 Melanocytic nevi of left upper limb, including shoulder: Secondary | ICD-10-CM

## 2021-12-26 DIAGNOSIS — D225 Melanocytic nevi of trunk: Secondary | ICD-10-CM

## 2021-12-26 DIAGNOSIS — Z1283 Encounter for screening for malignant neoplasm of skin: Secondary | ICD-10-CM | POA: Diagnosis not present

## 2021-12-26 DIAGNOSIS — L578 Other skin changes due to chronic exposure to nonionizing radiation: Secondary | ICD-10-CM

## 2021-12-26 DIAGNOSIS — L219 Seborrheic dermatitis, unspecified: Secondary | ICD-10-CM

## 2021-12-26 DIAGNOSIS — L720 Epidermal cyst: Secondary | ICD-10-CM

## 2021-12-26 DIAGNOSIS — D229 Melanocytic nevi, unspecified: Secondary | ICD-10-CM

## 2021-12-26 DIAGNOSIS — R591 Generalized enlarged lymph nodes: Secondary | ICD-10-CM

## 2021-12-26 DIAGNOSIS — L821 Other seborrheic keratosis: Secondary | ICD-10-CM

## 2021-12-26 DIAGNOSIS — D224 Melanocytic nevi of scalp and neck: Secondary | ICD-10-CM | POA: Diagnosis not present

## 2021-12-26 DIAGNOSIS — R21 Rash and other nonspecific skin eruption: Secondary | ICD-10-CM

## 2021-12-26 NOTE — Patient Instructions (Addendum)
Scalp:  Continue Ketoconazole 2% shampoo 2-3 times per week as directed.   Start Clobetasol solution 1-2 times daily to affected areas on scalp as needed for itching. (Patient has at home)  . Rash/Itching at arms and legs:  Continue Tacrolimus ointment once daily until improved.    Gentle Skin Care Guide  1. Bathe no more than once a day.  2. Avoid bathing in hot water  3. Use a mild soap like Dove, Vanicream, Cetaphil, CeraVe. Can use Lever 2000 or Cetaphil antibacterial soap  4. Use soap only where you need it. On most days, use it under your arms, between your legs, and on your feet. Let the water rinse other areas unless visibly dirty.  5. When you get out of the bath/shower, use a towel to gently blot your skin dry, don't rub it.  6. While your skin is still a little damp, apply a moisturizing cream such as Vanicream, CeraVe, Cetaphil, Eucerin, Sarna lotion or plain Vaseline Jelly. For hands apply Neutrogena [place] Hand Cream or Excipial Hand Cream.  7. Reapply moisturizer any time you start to itch or feel dry.  8. Sometimes using free and clear laundry detergents can be helpful. Fabric softener sheets should be avoided. Downy Free & Gentle liquid, or any liquid fabric softener that is free of dyes and perfumes, it acceptable to use  9. If your doctor has given you prescription creams you may apply moisturizers over them   Topical steroids (such as triamcinolone, fluocinolone, fluocinonide, mometasone, clobetasol, halobetasol, betamethasone, hydrocortisone) can cause thinning and lightening of the skin if they are used for too long in the same area. Your physician has selected the right strength medicine for your problem and area affected on the body. Please use your medication only as directed by your physician to prevent side effects.    Melanoma ABCDEs  Melanoma is the most dangerous type of skin cancer, and is the leading cause of death from skin disease.  You are  more likely to develop melanoma if you: Have light-colored skin, light-colored eyes, or red or blond hair Spend a lot of time in the sun Tan regularly, either outdoors or in a tanning bed Have had blistering sunburns, especially during childhood Have a close family member who has had a melanoma Have atypical moles or large birthmarks  Early detection of melanoma is key since treatment is typically straightforward and cure rates are extremely high if we catch it early.   The first sign of melanoma is often a change in a mole or a new dark spot.  The ABCDE system is a way of remembering the signs of melanoma.  A for asymmetry:  The two halves do not match. B for border:  The edges of the growth are irregular. C for color:  A mixture of colors are present instead of an even brown color. D for diameter:  Melanomas are usually (but not always) greater than 2m - the size of a pencil eraser. E for evolution:  The spot keeps changing in size, shape, and color.  Please check your skin once per month between visits. You can use a small mirror in front and a large mirror behind you to keep an eye on the back side or your body.   If you see any new or changing lesions before your next follow-up, please call to schedule a visit.  Please continue daily skin protection including broad spectrum sunscreen SPF 30+ to sun-exposed areas, reapplying every 2 hours as  needed when you're outdoors.   Staying in the shade or wearing long sleeves, sun glasses (UVA+UVB protection) and wide brim hats (4-inch brim around the entire circumference of the hat) are also recommended for sun protection.    Due to recent changes in healthcare laws, you may see results of your pathology and/or laboratory studies on MyChart before the doctors have had a chance to review them. We understand that in some cases there may be results that are confusing or concerning to you. Please understand that not all results are received at the  same time and often the doctors may need to interpret multiple results in order to provide you with the best plan of care or course of treatment. Therefore, we ask that you please give [place] 2 business days to thoroughly review all your results before contacting the office for clarification. Should we see a critical lab result, you will be contacted sooner.   If You Need Anything After Your Visit  If you have any questions or concerns for your doctor, please call our main line at [phone] and press option 4 to reach your doctor's medical assistant. If no one answers, please leave a voicemail as directed and we will return your call as soon as possible. Messages left after 4 pm will be answered the following business day.   You may also send [place] a message via [place]. We typically respond to MyChart messages within 1-2 business days.  For prescription refills, please ask your pharmacy to contact our office. Our fax number is [phone].  If you have an urgent issue when the clinic is closed that cannot wait until the next business day, you can page your doctor at the number below.    Please note that while we do our best to be available for urgent issues outside of office hours, we are not available 24/7.   If you have an urgent issue and are unable to reach [place], you may choose to seek medical care at your doctor's office, retail clinic, urgent care center, or emergency room.  If you have a medical emergency, please immediately call 911 or go to the emergency department.  Pager Numbers  - Dr. Nehemiah Massed: [phone]  - Dr. Laurence Ferrari: [phone]  - Dr. Nicole Kindred: [phone]  In the event of inclement weather, please call our main line at [phone] for an update on the status of any delays or closures.  Dermatology Medication Tips: Please keep the boxes that topical medications come in in order to help keep track of the instructions about where and how to use these. Pharmacies typically  print the medication instructions only on the boxes and not directly on the medication tubes.   If your medication is too expensive, please contact our office at [phone] option 4 or send [place] a message through [place].   We are unable to tell what your co-pay for medications will be in advance as this is different depending on your insurance coverage. However, we may be able to find a substitute medication at lower cost or fill out paperwork to get insurance to cover a needed medication.   If a prior authorization is required to get your medication covered by your insurance company, please allow [place] 1-2 business days to complete this process.  Drug prices often vary depending on where the prescription is filled and some pharmacies may offer cheaper prices.  The website www.goodrx.com contains coupons for medications through different pharmacies. The prices here do not account for what the  cost may be with help from insurance (it may be cheaper with your insurance), but the website can give you the price if you did not use any insurance.  - You can print the associated coupon and take it with your prescription to the pharmacy.  - You may also stop by our office during regular business hours and pick up a GoodRx coupon card.  - If you need your prescription sent electronically to a different pharmacy, notify our office through [facility] or by phone at [phone] option 4.     Si Usted Necesita Algo Despus de Su Visita  Tambin puede enviarnos un mensaje a travs de Pharmacist, community. Por lo general respondemos a los mensajes de MyChart en el transcurso de 1 a 2 das hbiles.  Para renovar recetas, por favor pida a su farmacia que se ponga en contacto con nuestra oficina. Harland Dingwall de fax es [place] [phone].  Si tiene un asunto urgente cuando la clnica est cerrada y que no puede esperar hasta el siguiente da hbil, puede llamar/localizar a su doctor(a) al nmero que aparece a  continuacin.   Por favor, tenga en cuenta que aunque hacemos todo lo posible para estar disponibles para asuntos urgentes fuera del horario de [place], no estamos disponibles las 24 horas del da, los 7 das de la [place].   Si tiene un problema urgente y no puede comunicarse con nosotros, puede optar por buscar atencin mdica  en el consultorio de su doctor(a), en una clnica privada, en un centro de atencin urgente o en una sala de emergencias.  Si tiene Engineering geologist, por favor llame inmediatamente al 911 o vaya a la sala de emergencias.  Nmeros de bper  - Dr. Nehemiah Massed: [phone]  - Dra. Moye: [phone]  - Dra. Nicole Kindred: [phone]  En caso de inclemencias del [place], por favor llame a Johnsie Kindred principal al [phone] para una actualizacin sobre el [place] de cualquier retraso o cierre.  Consejos para la medicacin en dermatologa: Por favor, guarde las cajas en las que vienen los medicamentos de uso tpico para ayudarle a seguir las instrucciones sobre dnde y cmo usarlos. Las farmacias generalmente imprimen las instrucciones del medicamento slo en las cajas y no directamente en los tubos del [place].   Si su medicamento es muy caro, por favor, pngase en contacto con Zigmund Daniel llamando al [phone] y presione la opcin 4 o envenos un mensaje a travs de Pharmacist, community.   No podemos decirle cul ser su copago por los medicamentos por adelantado ya que esto es diferente dependiendo de la cobertura de su seguro. Sin embargo, es posible que podamos encontrar un medicamento sustituto a Electrical engineer un formulario para que el seguro cubra el medicamento que se considera necesario.   Si se requiere una autorizacin previa para que su compaa de seguros [place] su medicamento, por favor permtanos de 1 a 2 das hbiles para completar este proceso.  Los precios de los medicamentos varan con frecuencia dependiendo del Environmental consultant de dnde se surte la receta  y alguna farmacias pueden ofrecer precios ms baratos.  El sitio web www.goodrx.com tiene cupones para medicamentos de Airline pilot. Los precios aqu no tienen en cuenta lo que podra costar con la ayuda del seguro (puede ser ms barato con su seguro), pero el sitio web puede darle el precio si no utiliz Research scientist (physical sciences).  - Puede imprimir el cupn correspondiente y llevarlo con su receta a la farmacia.  - Tambin puede pasar por  nuestra oficina durante el horario de atencin regular y Charity fundraiser una tarjeta de cupones de GoodRx.  - Si necesita que su receta se enve electrnicamente a una farmacia diferente, informe a nuestra oficina a travs de MyChart de [place] o por telfono llamando al [phone] y presione la opcin 4.

## 2021-12-26 NOTE — Progress Notes (Signed)
Follow-Up Visit   Subjective  Keith Giles is a 88 y.o. male who presents for the following: Annual Exam (No personal Hx of skin cancer or dysplastic nevi).  He still gets itching of his scalp every night.  He uses ketoconazole shampoo.  He also gets itching at his ankles.  The patient presents for Total-Body Skin Exam (TBSE) for skin cancer screening and mole check.  The patient has spots, moles and lesions to be evaluated, some may be new or changing and the patient has concerns that these could be cancer.   The following portions of the chart were reviewed this encounter and updated as appropriate:      Review of Systems: No other skin or systemic complaints except as noted in HPI or Assessment and Plan.   Objective  Well appearing patient in no apparent distress; mood and affect are within normal limits.  A full examination was performed including scalp, head, eyes, ears, nose, lips, neck, chest, axillae, abdomen, back, buttocks, bilateral upper extremities, bilateral lower extremities, hands, feet, fingers, toes, fingernails, and toenails. All findings within normal limits unless otherwise noted below.  Left Postauricular scalp, Left Upper Arm, Left Upper Back Paraspinal Left upper arm 38m med-dark brown macule  Left postauricular scalp 1cm flesh papule  Left upper back paraspinal 439mpink/flesh papule   Scalp Scaling at occipital scalp  Right Lower Leg - Posterior Few scattered pink scaly papules at BL lower legs  Left Anterior Neck, right anterior neck 1.0cm subcutaneous nodules x2 at left neck, 1.0cm subcutaneous nodule at right neck  Right Lower Back at waist line 1cm subcutaneous nodule at right lower back at waist line   Assessment & Plan   Lentigines - Scattered tan macules - Due to sun exposure - Benign-appearing, observe - Recommend daily broad spectrum sunscreen SPF 30+ to sun-exposed areas, reapply every 2 hours as needed. - Call for any  changes  Seborrheic Keratoses - Stuck-on, waxy, tan-brown papules and/or plaques  - Benign-appearing - Discussed benign etiology and prognosis. - Observe - Call for any changes  Melanocytic Nevi - Tan-brown and/or pink-flesh-colored symmetric macules and papules - Benign appearing on exam today - Observation - Call clinic for new or changing moles - Recommend daily use of broad spectrum spf 30+ sunscreen to sun-exposed areas.   Hemangiomas - Red papules - Discussed benign nature - Observe - Call for any changes  Actinic Damage - Chronic condition, secondary to cumulative UV/sun exposure - diffuse scaly erythematous macules with underlying dyspigmentation - Recommend daily broad spectrum sunscreen SPF 30+ to sun-exposed areas, reapply every 2 hours as needed.  - Staying in the shade or wearing long sleeves, sun glasses (UVA+UVB protection) and wide brim hats (4-inch brim around the entire circumference of the hat) are also recommended for sun protection.  - Call for new or changing lesions.  Skin cancer screening performed today.  Nevus (3) Left Upper Arm; Left Upper Back Paraspinal; Left Postauricular scalp  Benign-appearing. Stable compared to previous visit. Observation.  Call clinic for new or changing moles.  Recommend daily use of broad spectrum spf 30+ sunscreen to sun-exposed areas.    Seborrheic dermatitis Scalp  Chronic and persistent condition with duration or expected duration over one year. Condition is symptomatic/ bothersome to patient. Not currently at goal.   Continue Ketoconazole 2% shampoo 2-3 times per week as directed.   Start Clobetasol solution 1-2 times daily to affected areas on scalp as needed for itching. (Patient has at home)  Related Medications fluocinonide (LIDEX) 0.05 % external solution Apply to affected areas scalp 1-2 times a day as needed for itch. Avoid face, groin, axilla.  ketoconazole (NIZORAL) 2 % shampoo Apply to scalp 2-3  times a week and let sit several minutes before rinsing.  Rash Right Lower Leg - Posterior  Chronic and persistent condition with duration or expected duration over one year. Condition is symptomatic/ bothersome to patient. Improving but not currently at goal.     Continue Tacrolimus 0.1% ointment qd/bid until improved.  Recommend mild soap and moisturizing cream 1-2 times daily.  Gentle skin care handout provided.    Related Medications clobetasol (TEMOVATE) 0.05 % external solution Patient to mix solution in jar of CeraVe Cream and apply twice daily to arms and legs until rash improved.  tacrolimus (PROTOPIC) 0.1 % ointment Apply to rash on arms and legs once a day until improved. (Apply after clobetasol/CeraVe mix)  Lymphadenopathy Left Anterior Neck, right anterior neck  Vrs cysts  Benign-appearing. No change per pt. Recommend observation. Discussed option of surgical excision to remove it if it is growing, symptomatic, or other changes noted to confirm. Would refer to ENT. Please call for new or changing lesions so they can be evaluated.  Recommend follow up with PCP for evaluation. Pt has COPD and gets persistent drainage    Epidermal inclusion cyst Right Lower Back at waist line  Benign-appearing. Exam most consistent with an epidermal inclusion cyst. Discussed that a cyst is a benign growth that can grow over time and sometimes get irritated or inflamed. Recommend observation if it is not bothersome. Discussed option of surgical excision to remove it if it is growing, symptomatic, or other changes noted. Please call for new or changing lesions so they can be evaluated.     Return in about 1 year (around 12/27/2022) for TBSE.  I, Emelia Salisbury, CMA, am acting as scribe for Brendolyn Patty, MD.  Documentation: I have reviewed the above documentation for accuracy and completeness, and I agree with the above.  Brendolyn Patty MD

## 2021-12-29 DIAGNOSIS — E559 Vitamin D deficiency, unspecified: Secondary | ICD-10-CM | POA: Diagnosis not present

## 2021-12-29 DIAGNOSIS — E538 Deficiency of other specified B group vitamins: Secondary | ICD-10-CM | POA: Diagnosis not present

## 2022-01-11 DIAGNOSIS — H0100B Unspecified blepharitis left eye, upper and lower eyelids: Secondary | ICD-10-CM | POA: Diagnosis not present

## 2022-01-11 DIAGNOSIS — H0100A Unspecified blepharitis right eye, upper and lower eyelids: Secondary | ICD-10-CM | POA: Diagnosis not present

## 2022-01-11 DIAGNOSIS — H401133 Primary open-angle glaucoma, bilateral, severe stage: Secondary | ICD-10-CM | POA: Diagnosis not present

## 2022-01-11 DIAGNOSIS — H353132 Nonexudative age-related macular degeneration, bilateral, intermediate dry stage: Secondary | ICD-10-CM | POA: Diagnosis not present

## 2022-03-07 NOTE — Progress Notes (Unsigned)
PCP:  Lavone Orn, MD Primary Cardiologist: Cristopher Peru, MD Electrophysiologist: Cristopher Peru, MD   Keith Giles is a 89 y.o. male seen today for Cristopher Peru, MD for routine electrophysiology followup. Since last being seen in our clinic the patient reports doing very well from a cardiac perspective.  he denies chest pain, palpitations, dyspnea, PND, orthopnea, nausea, vomiting, dizziness, syncope, edema, weight gain, or early satiety.   Past Medical History:  Diagnosis Date   Arthritis    Bladder stone    Bladder tumor    BPH (benign prostatic hyperplasia)    Chronic asthmatic bronchitis    COPD (chronic obstructive pulmonary disease) (HCC)    Diverticulosis of colon    Full dentures    History of adenomatous polyp of colon    2002 HYPERPLASTIC   History of TIA (transient ischemic attack)    PER PT STATES HAD TIA SEVERAL YRS NO RESIDUAL AND DID NOT SEEK MEDICAL ATTENTION   Hyperlipidemia    Hypertension    IBS (irritable bowel syndrome)    Microhematuria    Milk intolerance    Right inguinal hernia    Shortness of breath    Urgency of urination    Wears glasses    Wears hearing aid    BILATERAL    Current Outpatient Medications  Medication Instructions   albuterol (VENTOLIN HFA) 108 (90 Base) MCG/ACT inhaler 1 puff, Oral, As needed, PRN AS NEEDED FOR WHEEZING PER PT   ANORO ELLIPTA 62.5-25 MCG/INH AEPB 1 puff, Inhalation, Daily   apixaban (ELIQUIS) 5 MG TABS tablet TAKE 1 TABLET TWICE A DAY   atorvastatin (LIPITOR) 20 mg, Oral, Every evening   clobetasol (TEMOVATE) 0.05 % external solution Patient to mix solution in jar of CeraVe Cream and apply twice daily to arms and legs until rash improved.   CREON 36000-114000 units CPEP capsule Oral, 1 capsule in the morning, 2 capsules at noon, 2 capsules in the evening   finasteride (PROSCAR) 5 MG tablet 1 tablet, Oral, Daily   fluocinonide (LIDEX) 0.05 % external solution Apply to affected areas scalp 1-2 times a day as  needed for itch. Avoid face, groin, axilla.   ketoconazole (NIZORAL) 2 % shampoo Apply to scalp 2-3 times a week and let sit several minutes before rinsing.   latanoprost (XALATAN) 0.005 % ophthalmic solution 1 drop, Both Eyes, Daily   metoprolol succinate (TOPROL-XL) 25 mg, Oral, Daily   tacrolimus (PROTOPIC) 0.1 % ointment Apply to rash on arms and legs once a day until improved. (Apply after clobetasol/CeraVe mix)   tadalafil (CIALIS) 20 MG tablet No dose, route, or frequency recorded.   tamsulosin (FLOMAX) 0.4 mg, Oral, Daily    Physical Exam: Vitals:   03/09/22 1154  BP: 132/70  Pulse: 65  SpO2: 97%  Weight: 156 lb 6.4 oz (70.9 kg)  Height: 5' 11.5" (1.816 m)    GEN- NAD. A&O x 3. Normal affect. HEENT: normocephalic, atraumatic Lungs- CTAB, Normal effort Heart- Regular rate and rhythm, No M/G/R Extremities- No peripheral edema. no clubbing or cyanosis; Skin- warm and dry, no rash or lesion  EKG is ordered today. Personal review shows NSR with PACs vs sinus arrhyhtmia at 65 bpm  Additional studies reviewed include: Previous EP notes.   Assessment and Plan:  1. Paroxysmal AF No symptoms, He feels like his one episode was solitary.  Continue eliquis 5 mg BID for CHA2DS2VASc of at least 3 Labs today. Previously, Eliquis dose has been appropriate.   2.  HTN Stable on current regimen   3. HLD Per PCP  Follow up with Dr. [name] in 12 months, sooner with issues or concerns.  Shirley Friar, PA-C  03/09/22 12:02 PM

## 2022-03-09 ENCOUNTER — Ambulatory Visit: Payer: Medicare Other | Attending: Student | Admitting: Student

## 2022-03-09 ENCOUNTER — Encounter: Payer: Self-pay | Admitting: Student

## 2022-03-09 VITALS — BP 132/70 | HR 65 | Ht 71.5 in | Wt 156.4 lb

## 2022-03-09 DIAGNOSIS — I10 Essential (primary) hypertension: Secondary | ICD-10-CM | POA: Diagnosis not present

## 2022-03-09 DIAGNOSIS — I480 Paroxysmal atrial fibrillation: Secondary | ICD-10-CM | POA: Diagnosis not present

## 2022-03-09 LAB — CBC

## 2022-03-09 NOTE — Patient Instructions (Signed)
Medication Instructions:  Your physician recommends that you continue on your current medications as directed. Please refer to the Current Medication list given to you today.  *If you need a refill on your cardiac medications before your next appointment, please call your pharmacy*   Lab Work: TODAY: BMET, CBC  If you have labs (blood work) drawn today and your tests are completely normal, you will receive your results only by: [place] (if you have MyChart) OR A paper copy in the mail If you have any lab test that is abnormal or we need to change your treatment, we will call you to review the results.   Follow-Up: At [facility], you and your health needs are our priority.  As part of our continuing mission to provide you with exceptional heart care, we have created designated Provider Care Teams.  These Care Teams include your primary Cardiologist (physician) and Advanced Practice Providers (APPs -  Physician Assistants and Nurse Practitioners) who all work together to provide you with the care you need, when you need it.  Your next appointment:   1 year(s)  Provider:   Cristopher Peru, MD

## 2022-03-10 LAB — BASIC METABOLIC PANEL
BUN/Creatinine Ratio: 15 (ref 10–24)
BUN: 15 mg/dL (ref 8–27)
CO2: 24 mmol/L (ref 20–29)
Calcium: 9.3 mg/dL (ref 8.6–10.2)
Chloride: 100 mmol/L (ref 96–106)
Creatinine, Ser: 1 mg/dL (ref 0.76–1.27)
Glucose: 97 mg/dL (ref 70–99)
Potassium: 4.5 mmol/L (ref 3.5–5.2)
Sodium: 137 mmol/L (ref 134–144)
eGFR: 73 mL/min/{1.73_m2} (ref >59)

## 2022-03-10 LAB — CBC
Hematocrit: 44.3 % (ref 37.5–51.0)
Hemoglobin: 14.6 g/dL (ref 13.0–17.7)
MCH: 31.4 pg (ref 26.6–33.0)
MCHC: 33 g/dL (ref 31.5–35.7)
MCV: 95 fL (ref 79–97)
Platelets: 239 10*3/uL (ref 150–450)
RBC: 4.65 x10E6/uL (ref 4.14–5.80)
RDW: 12.9 % (ref 11.6–15.4)
WBC: 8.7 10*3/uL (ref 3.4–10.8)

## 2022-03-24 DIAGNOSIS — R197 Diarrhea, unspecified: Secondary | ICD-10-CM | POA: Diagnosis not present

## 2022-03-24 DIAGNOSIS — I10 Essential (primary) hypertension: Secondary | ICD-10-CM | POA: Diagnosis not present

## 2022-03-24 DIAGNOSIS — R634 Abnormal weight loss: Secondary | ICD-10-CM | POA: Diagnosis not present

## 2022-03-24 DIAGNOSIS — K589 Irritable bowel syndrome without diarrhea: Secondary | ICD-10-CM | POA: Diagnosis not present

## 2022-03-24 DIAGNOSIS — J449 Chronic obstructive pulmonary disease, unspecified: Secondary | ICD-10-CM | POA: Diagnosis not present

## 2022-04-03 DIAGNOSIS — H353132 Nonexudative age-related macular degeneration, bilateral, intermediate dry stage: Secondary | ICD-10-CM | POA: Diagnosis not present

## 2022-04-03 DIAGNOSIS — H401132 Primary open-angle glaucoma, bilateral, moderate stage: Secondary | ICD-10-CM | POA: Diagnosis not present

## 2022-04-03 DIAGNOSIS — H35033 Hypertensive retinopathy, bilateral: Secondary | ICD-10-CM | POA: Diagnosis not present

## 2022-04-03 DIAGNOSIS — D3131 Benign neoplasm of right choroid: Secondary | ICD-10-CM | POA: Diagnosis not present

## 2022-04-03 DIAGNOSIS — H35373 Puckering of macula, bilateral: Secondary | ICD-10-CM | POA: Diagnosis not present

## 2022-04-10 DIAGNOSIS — H401132 Primary open-angle glaucoma, bilateral, moderate stage: Secondary | ICD-10-CM | POA: Diagnosis not present

## 2022-04-10 DIAGNOSIS — H35033 Hypertensive retinopathy, bilateral: Secondary | ICD-10-CM | POA: Diagnosis not present

## 2022-04-10 DIAGNOSIS — H353132 Nonexudative age-related macular degeneration, bilateral, intermediate dry stage: Secondary | ICD-10-CM | POA: Diagnosis not present

## 2022-04-10 DIAGNOSIS — H34832 Tributary (branch) retinal vein occlusion, left eye, with macular edema: Secondary | ICD-10-CM | POA: Diagnosis not present

## 2022-04-10 DIAGNOSIS — H35373 Puckering of macula, bilateral: Secondary | ICD-10-CM | POA: Diagnosis not present

## 2022-05-04 DIAGNOSIS — R197 Diarrhea, unspecified: Secondary | ICD-10-CM | POA: Diagnosis not present

## 2022-05-08 DIAGNOSIS — N401 Enlarged prostate with lower urinary tract symptoms: Secondary | ICD-10-CM | POA: Diagnosis not present

## 2022-05-08 DIAGNOSIS — E7800 Pure hypercholesterolemia, unspecified: Secondary | ICD-10-CM | POA: Diagnosis not present

## 2022-05-08 DIAGNOSIS — Z0000 Encounter for general adult medical examination without abnormal findings: Secondary | ICD-10-CM | POA: Diagnosis not present

## 2022-05-08 DIAGNOSIS — Z23 Encounter for immunization: Secondary | ICD-10-CM | POA: Diagnosis not present

## 2022-05-08 DIAGNOSIS — Z8551 Personal history of malignant neoplasm of bladder: Secondary | ICD-10-CM | POA: Diagnosis not present

## 2022-05-08 DIAGNOSIS — K8689 Other specified diseases of pancreas: Secondary | ICD-10-CM | POA: Diagnosis not present

## 2022-05-08 DIAGNOSIS — F17200 Nicotine dependence, unspecified, uncomplicated: Secondary | ICD-10-CM | POA: Diagnosis not present

## 2022-05-08 DIAGNOSIS — I480 Paroxysmal atrial fibrillation: Secondary | ICD-10-CM | POA: Diagnosis not present

## 2022-05-08 DIAGNOSIS — K589 Irritable bowel syndrome without diarrhea: Secondary | ICD-10-CM | POA: Diagnosis not present

## 2022-05-08 DIAGNOSIS — I700 Atherosclerosis of aorta: Secondary | ICD-10-CM | POA: Diagnosis not present

## 2022-05-08 DIAGNOSIS — I10 Essential (primary) hypertension: Secondary | ICD-10-CM | POA: Diagnosis not present

## 2022-05-08 DIAGNOSIS — R197 Diarrhea, unspecified: Secondary | ICD-10-CM | POA: Diagnosis not present

## 2022-05-08 DIAGNOSIS — J449 Chronic obstructive pulmonary disease, unspecified: Secondary | ICD-10-CM | POA: Diagnosis not present

## 2022-05-08 DIAGNOSIS — Z1331 Encounter for screening for depression: Secondary | ICD-10-CM | POA: Diagnosis not present

## 2022-06-07 ENCOUNTER — Other Ambulatory Visit: Payer: Self-pay | Admitting: Internal Medicine

## 2022-06-07 DIAGNOSIS — I480 Paroxysmal atrial fibrillation: Secondary | ICD-10-CM

## 2022-06-07 NOTE — Telephone Encounter (Signed)
Prescription refill request for Eliquis received. Indication: Afib  Last office visit: 03/09/22 Lanna Poche) Scr: 1.00 (03/09/22)  Age: 89 Weight: 70.9kg  Appropriate dose. Refill sent.

## 2022-06-12 DIAGNOSIS — H353132 Nonexudative age-related macular degeneration, bilateral, intermediate dry stage: Secondary | ICD-10-CM | POA: Diagnosis not present

## 2022-06-12 DIAGNOSIS — H34832 Tributary (branch) retinal vein occlusion, left eye, with macular edema: Secondary | ICD-10-CM | POA: Diagnosis not present

## 2022-06-12 DIAGNOSIS — H401132 Primary open-angle glaucoma, bilateral, moderate stage: Secondary | ICD-10-CM | POA: Diagnosis not present

## 2022-06-12 DIAGNOSIS — D3131 Benign neoplasm of right choroid: Secondary | ICD-10-CM | POA: Diagnosis not present

## 2022-06-12 DIAGNOSIS — H35033 Hypertensive retinopathy, bilateral: Secondary | ICD-10-CM | POA: Diagnosis not present

## 2022-06-12 DIAGNOSIS — H35373 Puckering of macula, bilateral: Secondary | ICD-10-CM | POA: Diagnosis not present

## 2022-06-13 ENCOUNTER — Other Ambulatory Visit: Payer: Self-pay | Admitting: Internal Medicine

## 2022-07-10 DIAGNOSIS — H34832 Tributary (branch) retinal vein occlusion, left eye, with macular edema: Secondary | ICD-10-CM | POA: Diagnosis not present

## 2022-07-13 DIAGNOSIS — M25511 Pain in right shoulder: Secondary | ICD-10-CM | POA: Diagnosis not present

## 2022-07-13 DIAGNOSIS — H02849 Edema of unspecified eye, unspecified eyelid: Secondary | ICD-10-CM | POA: Diagnosis not present

## 2022-07-25 DIAGNOSIS — R152 Fecal urgency: Secondary | ICD-10-CM | POA: Diagnosis not present

## 2022-07-25 DIAGNOSIS — R195 Other fecal abnormalities: Secondary | ICD-10-CM | POA: Diagnosis not present

## 2022-07-25 DIAGNOSIS — K8689 Other specified diseases of pancreas: Secondary | ICD-10-CM | POA: Diagnosis not present

## 2022-07-25 DIAGNOSIS — K529 Noninfective gastroenteritis and colitis, unspecified: Secondary | ICD-10-CM | POA: Diagnosis not present

## 2022-08-07 DIAGNOSIS — H34832 Tributary (branch) retinal vein occlusion, left eye, with macular edema: Secondary | ICD-10-CM | POA: Diagnosis not present

## 2022-10-13 DIAGNOSIS — H35033 Hypertensive retinopathy, bilateral: Secondary | ICD-10-CM | POA: Diagnosis not present

## 2022-10-13 DIAGNOSIS — H43813 Vitreous degeneration, bilateral: Secondary | ICD-10-CM | POA: Diagnosis not present

## 2022-10-13 DIAGNOSIS — H401132 Primary open-angle glaucoma, bilateral, moderate stage: Secondary | ICD-10-CM | POA: Diagnosis not present

## 2022-10-13 DIAGNOSIS — H35373 Puckering of macula, bilateral: Secondary | ICD-10-CM | POA: Diagnosis not present

## 2022-10-13 DIAGNOSIS — H34832 Tributary (branch) retinal vein occlusion, left eye, with macular edema: Secondary | ICD-10-CM | POA: Diagnosis not present

## 2022-10-13 DIAGNOSIS — D3131 Benign neoplasm of right choroid: Secondary | ICD-10-CM | POA: Diagnosis not present

## 2022-10-13 DIAGNOSIS — H353132 Nonexudative age-related macular degeneration, bilateral, intermediate dry stage: Secondary | ICD-10-CM | POA: Diagnosis not present

## 2022-11-10 DIAGNOSIS — H401133 Primary open-angle glaucoma, bilateral, severe stage: Secondary | ICD-10-CM | POA: Diagnosis not present

## 2022-11-10 DIAGNOSIS — Z961 Presence of intraocular lens: Secondary | ICD-10-CM | POA: Diagnosis not present

## 2022-11-10 DIAGNOSIS — H353132 Nonexudative age-related macular degeneration, bilateral, intermediate dry stage: Secondary | ICD-10-CM | POA: Diagnosis not present

## 2022-11-10 DIAGNOSIS — H52203 Unspecified astigmatism, bilateral: Secondary | ICD-10-CM | POA: Diagnosis not present

## 2022-11-10 DIAGNOSIS — H524 Presbyopia: Secondary | ICD-10-CM | POA: Diagnosis not present

## 2022-11-13 DIAGNOSIS — N5201 Erectile dysfunction due to arterial insufficiency: Secondary | ICD-10-CM | POA: Diagnosis not present

## 2022-11-13 DIAGNOSIS — C674 Malignant neoplasm of posterior wall of bladder: Secondary | ICD-10-CM | POA: Diagnosis not present

## 2022-11-13 DIAGNOSIS — N200 Calculus of kidney: Secondary | ICD-10-CM | POA: Diagnosis not present

## 2022-11-13 DIAGNOSIS — R3912 Poor urinary stream: Secondary | ICD-10-CM | POA: Diagnosis not present

## 2022-11-20 DIAGNOSIS — J449 Chronic obstructive pulmonary disease, unspecified: Secondary | ICD-10-CM | POA: Diagnosis not present

## 2022-11-20 DIAGNOSIS — I480 Paroxysmal atrial fibrillation: Secondary | ICD-10-CM | POA: Diagnosis not present

## 2022-11-20 DIAGNOSIS — Z23 Encounter for immunization: Secondary | ICD-10-CM | POA: Diagnosis not present

## 2022-11-20 DIAGNOSIS — R195 Other fecal abnormalities: Secondary | ICD-10-CM | POA: Diagnosis not present

## 2022-11-20 DIAGNOSIS — I10 Essential (primary) hypertension: Secondary | ICD-10-CM | POA: Diagnosis not present

## 2022-11-20 DIAGNOSIS — K529 Noninfective gastroenteritis and colitis, unspecified: Secondary | ICD-10-CM | POA: Diagnosis not present

## 2022-11-20 DIAGNOSIS — R61 Generalized hyperhidrosis: Secondary | ICD-10-CM | POA: Diagnosis not present

## 2022-11-20 DIAGNOSIS — K8689 Other specified diseases of pancreas: Secondary | ICD-10-CM | POA: Diagnosis not present

## 2022-11-20 DIAGNOSIS — I700 Atherosclerosis of aorta: Secondary | ICD-10-CM | POA: Diagnosis not present

## 2022-11-20 DIAGNOSIS — N401 Enlarged prostate with lower urinary tract symptoms: Secondary | ICD-10-CM | POA: Diagnosis not present

## 2022-11-20 DIAGNOSIS — R152 Fecal urgency: Secondary | ICD-10-CM | POA: Diagnosis not present

## 2022-11-20 DIAGNOSIS — E7800 Pure hypercholesterolemia, unspecified: Secondary | ICD-10-CM | POA: Diagnosis not present

## 2022-12-04 DIAGNOSIS — H34832 Tributary (branch) retinal vein occlusion, left eye, with macular edema: Secondary | ICD-10-CM | POA: Diagnosis not present

## 2022-12-04 DIAGNOSIS — H353112 Nonexudative age-related macular degeneration, right eye, intermediate dry stage: Secondary | ICD-10-CM | POA: Diagnosis not present

## 2022-12-04 DIAGNOSIS — H353221 Exudative age-related macular degeneration, left eye, with active choroidal neovascularization: Secondary | ICD-10-CM | POA: Diagnosis not present

## 2022-12-04 DIAGNOSIS — D3131 Benign neoplasm of right choroid: Secondary | ICD-10-CM | POA: Diagnosis not present

## 2022-12-04 DIAGNOSIS — H35373 Puckering of macula, bilateral: Secondary | ICD-10-CM | POA: Diagnosis not present

## 2022-12-04 DIAGNOSIS — H401132 Primary open-angle glaucoma, bilateral, moderate stage: Secondary | ICD-10-CM | POA: Diagnosis not present

## 2022-12-04 DIAGNOSIS — H35033 Hypertensive retinopathy, bilateral: Secondary | ICD-10-CM | POA: Diagnosis not present

## 2022-12-04 DIAGNOSIS — H43813 Vitreous degeneration, bilateral: Secondary | ICD-10-CM | POA: Diagnosis not present

## 2022-12-06 DIAGNOSIS — R319 Hematuria, unspecified: Secondary | ICD-10-CM | POA: Diagnosis not present

## 2022-12-20 ENCOUNTER — Telehealth: Payer: Self-pay | Admitting: Internal Medicine

## 2022-12-20 NOTE — Telephone Encounter (Signed)
Could not leave message.

## 2022-12-20 NOTE — Telephone Encounter (Signed)
Pt would like a c/b from nurse regarding history of his heart skipping beats per his PCP. Please advise

## 2022-12-21 NOTE — Telephone Encounter (Signed)
Spoke with Pt. Pt states he was told he had "missing" heart beats [facility]. Asked Pt to have them release a copy of Office notes and/or EKG to our office so I could address if with Dr Ladona Ridgel or Otilio Saber, PA-C. Pt agreed. Told pt to call with any other questions/concerns and we would process forward after they had reviewed.

## 2022-12-22 DIAGNOSIS — R319 Hematuria, unspecified: Secondary | ICD-10-CM | POA: Diagnosis not present

## 2022-12-22 DIAGNOSIS — I10 Essential (primary) hypertension: Secondary | ICD-10-CM | POA: Diagnosis not present

## 2022-12-22 DIAGNOSIS — M25552 Pain in left hip: Secondary | ICD-10-CM | POA: Diagnosis not present

## 2022-12-22 DIAGNOSIS — M79652 Pain in left thigh: Secondary | ICD-10-CM | POA: Diagnosis not present

## 2022-12-25 ENCOUNTER — Ambulatory Visit: Payer: Medicare Other | Admitting: Dermatology

## 2022-12-25 DIAGNOSIS — D229 Melanocytic nevi, unspecified: Secondary | ICD-10-CM

## 2022-12-25 DIAGNOSIS — Z872 Personal history of diseases of the skin and subcutaneous tissue: Secondary | ICD-10-CM

## 2022-12-25 DIAGNOSIS — D225 Melanocytic nevi of trunk: Secondary | ICD-10-CM

## 2022-12-25 DIAGNOSIS — R591 Generalized enlarged lymph nodes: Secondary | ICD-10-CM | POA: Diagnosis not present

## 2022-12-25 DIAGNOSIS — D2239 Melanocytic nevi of other parts of face: Secondary | ICD-10-CM

## 2022-12-25 DIAGNOSIS — L821 Other seborrheic keratosis: Secondary | ICD-10-CM

## 2022-12-25 DIAGNOSIS — L578 Other skin changes due to chronic exposure to nonionizing radiation: Secondary | ICD-10-CM

## 2022-12-25 DIAGNOSIS — W908XXA Exposure to other nonionizing radiation, initial encounter: Secondary | ICD-10-CM

## 2022-12-25 DIAGNOSIS — D2262 Melanocytic nevi of left upper limb, including shoulder: Secondary | ICD-10-CM | POA: Diagnosis not present

## 2022-12-25 DIAGNOSIS — L814 Other melanin hyperpigmentation: Secondary | ICD-10-CM | POA: Diagnosis not present

## 2022-12-25 DIAGNOSIS — L219 Seborrheic dermatitis, unspecified: Secondary | ICD-10-CM

## 2022-12-25 DIAGNOSIS — Z1283 Encounter for screening for malignant neoplasm of skin: Secondary | ICD-10-CM | POA: Diagnosis not present

## 2022-12-25 DIAGNOSIS — D224 Melanocytic nevi of scalp and neck: Secondary | ICD-10-CM | POA: Diagnosis not present

## 2022-12-25 DIAGNOSIS — D1801 Hemangioma of skin and subcutaneous tissue: Secondary | ICD-10-CM

## 2022-12-25 DIAGNOSIS — L720 Epidermal cyst: Secondary | ICD-10-CM | POA: Diagnosis not present

## 2022-12-25 DIAGNOSIS — S60512A Abrasion of left hand, initial encounter: Secondary | ICD-10-CM | POA: Diagnosis not present

## 2022-12-25 DIAGNOSIS — L729 Follicular cyst of the skin and subcutaneous tissue, unspecified: Secondary | ICD-10-CM

## 2022-12-25 MED ORDER — CLOBETASOL PROPIONATE 0.05 % EX SOLN: CUTANEOUS | 1 refills | Status: AC

## 2022-12-25 NOTE — Progress Notes (Signed)
Follow-Up Visit   Subjective  Keith Giles is a 89 y.o. male who presents for the following: Skin Cancer Screening and Full Body Skin Exam, Hx of isks  Mentioned a rough spot at right thigh  The patient presents for Total-Body Skin Exam (TBSE) for skin cancer screening and mole check. The patient has spots, moles and lesions to be evaluated, some may be new or changing and the patient may have concern these could be cancer.    The following portions of the chart were reviewed this encounter and updated as appropriate: medications, allergies, medical history  Review of Systems:  No other skin or systemic complaints except as noted in HPI or Assessment and Plan.  Objective  Well appearing patient in no apparent distress; mood and affect are within normal limits.  A full examination was performed including scalp, head, eyes, ears, nose, lips, neck, chest, axillae, abdomen, back, buttocks, bilateral upper extremities, bilateral lower extremities, hands, feet, fingers, toes, fingernails, and toenails. All findings within normal limits unless otherwise noted below.   Relevant physical exam findings are noted in the Assessment and Plan.    Assessment & Plan   SKIN CANCER SCREENING PERFORMED TODAY.  ACTINIC DAMAGE - Chronic condition, secondary to cumulative UV/sun exposure - diffuse scaly erythematous macules with underlying dyspigmentation - Recommend daily broad spectrum sunscreen SPF 30+ to sun-exposed areas, reapply every 2 hours as needed.  - Staying in the shade or wearing long sleeves, sun glasses (UVA+UVB protection) and wide brim hats (4-inch brim around the entire circumference of the hat) are also recommended for sun protection.  - Call for new or changing lesions.  LENTIGINES, SEBORRHEIC KERATOSES, HEMANGIOMAS - Benign normal skin lesions - Benign-appearing - Call for any changes  Sk at right anterior thigh  MELANOCYTIC NEVI - Tan-brown and/or pink-flesh-colored  symmetric macules and papules - Benign appearing on exam today - Observation - Call clinic for new or changing moles - Recommend daily use of broad spectrum spf 30+ sunscreen to sun-exposed areas.   Nevi Left upper arm 2mm med-dark brown macule (Vs blue nevus)    Left postauricular scalp 1cm flesh papule   Left upper back paraspinal 4mm pink/flesh papule   Left nasal ala - flesh brown papule    Benign-appearing. Stable compared to previous visit. Observation.  Call clinic for new or changing moles.  Recommend daily use of broad spectrum spf 30+ sunscreen to sun-exposed areas.    Healing EXCORIATION Exam: healing excoriation at left hand  Treatment Plan: History of cut  Recommend vaseline. Call if not resolving.   Epidermal inclusion cyst  Right Lower Back at waist line Exam : 1cm subcutaneous nodule at right lower back at waist line   Benign-appearing. Exam most consistent with an epidermal inclusion cyst. Discussed that a cyst is a benign growth that can grow over time and sometimes get irritated or inflamed. Recommend observation if it is not bothersome. Discussed option of surgical excision to remove it if it is growing, symptomatic, or other changes noted. Please call for new or changing lesions so they can be evaluated.  Lymphadenopathy Left Anterior Neck Exam 1.0cm subcutaneous nodules x1 at left neck  Benign-appearing. Stable. Recommend observation. Discussed option of surgical excision to remove it if it is growing, symptomatic, or other changes noted to confirm. Would refer to ENT. Please call for new or changing lesions so they can be evaluated.   continue monitoring with PCP. Pt has COPD and gets persistent drainage  Seborrheic  dermatitis  Related Medications ketoconazole (NIZORAL) 2 % shampoo Apply to scalp 2-3 times a week and let sit several minutes before rinsing.  clobetasol (TEMOVATE) 0.05 % external solution Apply topically a few drops qd/bid as needed  for itch at scalp   Seborrheic dermatitis Scalp  Exam Scaling at occipital scalp    Chronic and persistent condition with duration or expected duration over one year. Condition is symptomatic/ bothersome to patient. Not currently at goal.    Continue Ketoconazole 2% shampoo 2-3 times per week as directed.    Start clobetasol solution - apply a few drops qd/bid as need for itchy at scalp  Avoid applying to face, groin, and axilla. Use as directed. Long-term use can cause thinning of the skin.  Topical steroids (such as triamcinolone, fluocinolone, fluocinonide, mometasone, clobetasol, halobetasol, betamethasone, hydrocortisone) can cause thinning and lightening of the skin if they are used for too long in the same area. Your physician has selected the right strength medicine for your problem and area affected on the body. Please use your medication only as directed by your physician to prevent side effects.      Return in about 1 year (around 12/25/2023) for TBSE.  I, Asher Muir, CMA, am acting as scribe for Willeen Niece, MD.  Documentation: I have reviewed the above documentation for accuracy and completeness, and I agree with the above.  Willeen Niece, MD

## 2022-12-25 NOTE — Telephone Encounter (Signed)
Scan in chart.

## 2022-12-25 NOTE — Patient Instructions (Addendum)
Start clobetasol solution - apply topically a few drops daily to twice daily as needed for itchy scalp  Avoid applying to face, groin, and axilla. Use as directed. Long-term use can cause thinning of the skin.   Topical steroids (such as triamcinolone, fluocinolone, fluocinonide, mometasone, clobetasol, halobetasol, betamethasone, hydrocortisone) can cause thinning and lightening of the skin if they are used for too long in the same area. Your physician has selected the right strength medicine for your problem and area affected on the body. Please use your medication only as directed by your physician to prevent side effects.      Melanoma ABCDEs  Melanoma is the most dangerous type of skin cancer, and is the leading cause of death from skin disease.  You are more likely to develop melanoma if you: Have light-colored skin, light-colored eyes, or red or blond hair Spend a lot of time in the sun Tan regularly, either outdoors or in a tanning bed Have had blistering sunburns, especially during childhood Have a close family member who has had a melanoma Have atypical moles or large birthmarks  Early detection of melanoma is key since treatment is typically straightforward and cure rates are extremely high if we catch it early.   The first sign of melanoma is often a change in a mole or a new dark spot.  The ABCDE system is a way of remembering the signs of melanoma.  A for asymmetry:  The two halves do not match. B for border:  The edges of the growth are irregular. C for color:  A mixture of colors are present instead of an even brown color. D for diameter:  Melanomas are usually (but not always) greater than 6mm - the size of a pencil eraser. E for evolution:  The spot keeps changing in size, shape, and color.  Please check your skin once per month between visits. You can use a small mirror in front and a large mirror behind you to keep an eye on the back side or your body.   If you  see any new or changing lesions before your next follow-up, please call to schedule a visit.  Please continue daily skin protection including broad spectrum sunscreen SPF 30+ to sun-exposed areas, reapplying every 2 hours as needed when you're outdoors.   Staying in the shade or wearing long sleeves, sun glasses (UVA+UVB protection) and wide brim hats (4-inch brim around the entire circumference of the hat) are also recommended for sun protection.     Due to recent changes in healthcare laws, you may see results of your pathology and/or laboratory studies on MyChart before the doctors have had a chance to review them. We understand that in some cases there may be results that are confusing or concerning to you. Please understand that not all results are received at the same time and often the doctors may need to interpret multiple results in order to provide you with the best plan of care or course of treatment. Therefore, we ask that you please give [place] 2 business days to thoroughly review all your results before contacting the office for clarification. Should we see a critical lab result, you will be contacted sooner.   If You Need Anything After Your Visit  If you have any questions or concerns for your doctor, please call our main line at [phone] and press option 4 to reach your doctor's medical assistant. If no one answers, please leave a voicemail as directed and we will  return your call as soon as possible. Messages left after 4 pm will be answered the following business day.   You may also send [place] a message via MyChart. We typically respond to MyChart messages within 1-2 business days.  For prescription refills, please ask your pharmacy to contact our office. Our fax number is [phone].  If you have an urgent issue when the clinic is closed that cannot wait until the next business day, you can page your doctor at the number below.    Please note that while we do our best to be  available for urgent issues outside of office hours, we are not available 24/7.   If you have an urgent issue and are unable to reach [place], you may choose to seek medical care at your doctor's office, retail clinic, urgent care center, or emergency room.  If you have a medical emergency, please immediately call 911 or go to the emergency department.  Pager Numbers  - Dr. Gwen Pounds: [phone]  - Dr. Roseanne Reno: [phone]  - Dr. Katrinka Blazing: [phone]   In the event of inclement weather, please call our main line at [phone] for an update on the status of any delays or closures.  Dermatology Medication Tips: Please keep the boxes that topical medications come in in order to help keep track of the instructions about where and how to use these. Pharmacies typically print the medication instructions only on the boxes and not directly on the medication tubes.   If your medication is too expensive, please contact our office at [phone] option 4 or send [place] a message through MyChart.   We are unable to tell what your co-pay for medications will be in advance as this is different depending on your insurance coverage. However, we may be able to find a substitute medication at lower cost or fill out paperwork to get insurance to cover a needed medication.   If a prior authorization is required to get your medication covered by your insurance company, please allow [place] 1-2 business days to complete this process.  Drug prices often vary depending on where the prescription is filled and some pharmacies may offer cheaper prices.  The website www.goodrx.com contains coupons for medications through different pharmacies. The prices here do not account for what the cost may be with help from insurance (it may be cheaper with your insurance), but the website can give you the price if you did not use any insurance.  - You can print the associated coupon and take it with your prescription to the pharmacy.   - You may also stop by our office during regular business hours and pick up a GoodRx coupon card.  - If you need your prescription sent electronically to a different pharmacy, notify our office through [facility] or by phone at [phone] option 4.     Si Usted Necesita Algo Despus de Su Visita  Tambin puede enviarnos un mensaje a travs de Clinical cytogeneticist. Por lo general respondemos a los mensajes de MyChart en el transcurso de 1 a 2 das hbiles.  Para renovar recetas, por favor pida a su farmacia que se ponga en contacto con nuestra oficina. Annie Sable de fax es [place] [phone].  Si tiene un asunto urgente cuando la clnica est cerrada y que no puede esperar hasta el siguiente da hbil, puede llamar/localizar a su doctor(a) al nmero que aparece a continuacin.   Por favor, tenga en cuenta que aunque hacemos todo lo posible para estar disponibles para  asuntos urgentes fuera del horario de [place], no estamos disponibles las 24 horas del da, los 7 [address]  [address] de la [place].   Si tiene un problema urgente y no puede comunicarse con nosotros, puede optar por buscar atencin mdica  en el consultorio de su doctor(a), en una clnica privada, en un centro de atencin urgente o en una sala de emergencias.  Si tiene Engineer, drilling, por favor llame inmediatamente al 911 o vaya a la sala de emergencias.  Nmeros de bper  - Dr. Gwen Pounds: [phone]  - Dra. Roseanne Reno: [phone]  - Dr. Katrinka Blazing: [phone]   En caso de inclemencias del tiempo, por favor llame a Lacy Duverney principal al [phone] para una actualizacin sobre el [place] de cualquier retraso o cierre.  Consejos para la medicacin en dermatologa: Por favor, guarde las cajas en las que vienen los medicamentos de uso tpico para ayudarle a seguir las instrucciones sobre dnde y cmo usarlos. Las farmacias generalmente imprimen las instrucciones del medicamento slo en las cajas y no directamente en los tubos del  [place].   Si su medicamento es muy caro, por favor, pngase en contacto con Rolm Gala llamando al [phone] y presione la opcin 4 o envenos un mensaje a travs de Clinical cytogeneticist.   No podemos decirle cul ser su copago por los medicamentos por adelantado ya que esto es diferente dependiendo de la cobertura de su seguro. Sin embargo, es posible que podamos encontrar un medicamento sustituto a Audiological scientist un formulario para que el seguro cubra el medicamento que se considera necesario.   Si se requiere una autorizacin previa para que su compaa de seguros [place] su medicamento, por favor permtanos de 1 a 2 das hbiles para completar [address]  Los precios de los medicamentos varan con frecuencia dependiendo del Environmental consultant de dnde se surte la receta y alguna farmacias pueden ofrecer precios ms baratos.  El sitio web www.goodrx.com tiene cupones para medicamentos de Health and safety inspector. Los precios aqu no tienen en cuenta lo que podra costar con la ayuda del seguro (puede ser ms barato con su seguro), pero el sitio web puede darle el precio si no utiliz Tourist information centre manager.  - Puede imprimir el cupn correspondiente y llevarlo con su receta a la farmacia.  - Tambin puede pasar por nuestra oficina durante el horario de atencin regular y Education officer, museum una tarjeta de cupones de GoodRx.  - Si necesita que su receta se enve electrnicamente a una farmacia diferente, informe a nuestra oficina a travs de MyChart de [place] o por telfono llamando al [phone] y presione la opcin 4.

## 2022-12-26 NOTE — Telephone Encounter (Signed)
Spoke with Pt. Told message sent by Otilio Saber PA-C. Pt stated thanks

## 2023-01-29 DIAGNOSIS — H353221 Exudative age-related macular degeneration, left eye, with active choroidal neovascularization: Secondary | ICD-10-CM | POA: Diagnosis not present

## 2023-04-09 DIAGNOSIS — D3131 Benign neoplasm of right choroid: Secondary | ICD-10-CM | POA: Diagnosis not present

## 2023-04-09 DIAGNOSIS — H353113 Nonexudative age-related macular degeneration, right eye, advanced atrophic without subfoveal involvement: Secondary | ICD-10-CM | POA: Diagnosis not present

## 2023-04-09 DIAGNOSIS — H35033 Hypertensive retinopathy, bilateral: Secondary | ICD-10-CM | POA: Diagnosis not present

## 2023-04-09 DIAGNOSIS — H34832 Tributary (branch) retinal vein occlusion, left eye, with macular edema: Secondary | ICD-10-CM | POA: Diagnosis not present

## 2023-04-09 DIAGNOSIS — H401132 Primary open-angle glaucoma, bilateral, moderate stage: Secondary | ICD-10-CM | POA: Diagnosis not present

## 2023-04-09 DIAGNOSIS — H353221 Exudative age-related macular degeneration, left eye, with active choroidal neovascularization: Secondary | ICD-10-CM | POA: Diagnosis not present

## 2023-04-09 DIAGNOSIS — H43813 Vitreous degeneration, bilateral: Secondary | ICD-10-CM | POA: Diagnosis not present

## 2023-04-09 DIAGNOSIS — H35373 Puckering of macula, bilateral: Secondary | ICD-10-CM | POA: Diagnosis not present

## 2023-05-09 DIAGNOSIS — H34832 Tributary (branch) retinal vein occlusion, left eye, with macular edema: Secondary | ICD-10-CM | POA: Diagnosis not present

## 2023-05-09 DIAGNOSIS — H353212 Exudative age-related macular degeneration, right eye, with inactive choroidal neovascularization: Secondary | ICD-10-CM | POA: Diagnosis not present

## 2023-05-09 DIAGNOSIS — H04123 Dry eye syndrome of bilateral lacrimal glands: Secondary | ICD-10-CM | POA: Diagnosis not present

## 2023-05-09 DIAGNOSIS — Z961 Presence of intraocular lens: Secondary | ICD-10-CM | POA: Diagnosis not present

## 2023-05-09 DIAGNOSIS — H401133 Primary open-angle glaucoma, bilateral, severe stage: Secondary | ICD-10-CM | POA: Diagnosis not present

## 2023-05-14 DIAGNOSIS — I480 Paroxysmal atrial fibrillation: Secondary | ICD-10-CM | POA: Diagnosis not present

## 2023-05-14 DIAGNOSIS — L729 Follicular cyst of the skin and subcutaneous tissue, unspecified: Secondary | ICD-10-CM | POA: Diagnosis not present

## 2023-05-14 DIAGNOSIS — M79652 Pain in left thigh: Secondary | ICD-10-CM | POA: Diagnosis not present

## 2023-05-14 DIAGNOSIS — J441 Chronic obstructive pulmonary disease with (acute) exacerbation: Secondary | ICD-10-CM | POA: Diagnosis not present

## 2023-05-14 DIAGNOSIS — I700 Atherosclerosis of aorta: Secondary | ICD-10-CM | POA: Diagnosis not present

## 2023-05-14 DIAGNOSIS — N401 Enlarged prostate with lower urinary tract symptoms: Secondary | ICD-10-CM | POA: Diagnosis not present

## 2023-05-14 DIAGNOSIS — Z0000 Encounter for general adult medical examination without abnormal findings: Secondary | ICD-10-CM | POA: Diagnosis not present

## 2023-05-14 DIAGNOSIS — I10 Essential (primary) hypertension: Secondary | ICD-10-CM | POA: Diagnosis not present

## 2023-05-14 DIAGNOSIS — E7800 Pure hypercholesterolemia, unspecified: Secondary | ICD-10-CM | POA: Diagnosis not present

## 2023-05-14 DIAGNOSIS — R319 Hematuria, unspecified: Secondary | ICD-10-CM | POA: Diagnosis not present

## 2023-05-14 DIAGNOSIS — J449 Chronic obstructive pulmonary disease, unspecified: Secondary | ICD-10-CM | POA: Diagnosis not present

## 2023-05-14 DIAGNOSIS — Z1331 Encounter for screening for depression: Secondary | ICD-10-CM | POA: Diagnosis not present

## 2023-05-14 DIAGNOSIS — Z79899 Other long term (current) drug therapy: Secondary | ICD-10-CM | POA: Diagnosis not present

## 2023-05-14 DIAGNOSIS — M25552 Pain in left hip: Secondary | ICD-10-CM | POA: Diagnosis not present

## 2023-05-14 DIAGNOSIS — K8689 Other specified diseases of pancreas: Secondary | ICD-10-CM | POA: Diagnosis not present

## 2023-05-29 DIAGNOSIS — R3121 Asymptomatic microscopic hematuria: Secondary | ICD-10-CM | POA: Diagnosis not present

## 2023-05-29 DIAGNOSIS — N200 Calculus of kidney: Secondary | ICD-10-CM | POA: Diagnosis not present

## 2023-05-29 DIAGNOSIS — C674 Malignant neoplasm of posterior wall of bladder: Secondary | ICD-10-CM | POA: Diagnosis not present

## 2023-05-29 DIAGNOSIS — N5201 Erectile dysfunction due to arterial insufficiency: Secondary | ICD-10-CM | POA: Diagnosis not present

## 2023-05-29 DIAGNOSIS — R3912 Poor urinary stream: Secondary | ICD-10-CM | POA: Diagnosis not present

## 2023-06-04 ENCOUNTER — Other Ambulatory Visit: Payer: Self-pay | Admitting: Internal Medicine

## 2023-06-04 DIAGNOSIS — I480 Paroxysmal atrial fibrillation: Secondary | ICD-10-CM

## 2023-06-04 NOTE — Telephone Encounter (Signed)
 Prescription refill request for Eliquis  received. Indication:afib Last office visit:needs appt AOZ:HYQMV labs Age: 89 Weight:70.9  kg  Prescription refilled

## 2023-06-08 ENCOUNTER — Other Ambulatory Visit: Payer: Self-pay | Admitting: Internal Medicine

## 2023-06-20 ENCOUNTER — Other Ambulatory Visit: Payer: Self-pay | Admitting: Internal Medicine

## 2023-06-20 DIAGNOSIS — I480 Paroxysmal atrial fibrillation: Secondary | ICD-10-CM

## 2023-06-20 MED ORDER — METOPROLOL SUCCINATE ER 25 MG PO TB24: 25.0000 mg | ORAL_TABLET | Freq: Every day | ORAL | 1 refills | Status: DC

## 2023-06-20 MED ORDER — APIXABAN 5 MG PO TABS: 5.0000 mg | ORAL_TABLET | Freq: Two times a day (BID) | ORAL | 1 refills | Status: DC

## 2023-06-20 NOTE — Telephone Encounter (Signed)
*  STAT* If patient is at the pharmacy, call can be transferred to refill team.   1. Which medications need to be refilled? (please list name of each medication and dose if known) apixaban  (ELIQUIS ) 5 MG TABS tablet  metoprolol  succinate (TOPROL -XL) 25 MG 24 hr tablet   2. Which pharmacy/location (including street and city if local pharmacy) is medication to be sent to? [facility] - [place], [place] - [address]  3. Do they need a 30 day or 90 day supply? 90

## 2023-06-20 NOTE — Telephone Encounter (Signed)
 Received labwork from Keith Giles pt's PCP creat 0.99 on 05/14/23, age 89, weight 70.9kg, based on specified criteria pt is on appropriate dosage of Eliquis  5mg  BID for afib.  Will refill rx.

## 2023-06-20 NOTE — Telephone Encounter (Signed)
 Pt last saw Pilar Bridge, [place] on 03/09/22, pt is overdue for follow-up.  Pt has an upcoming appt scheduled with Pilar Bridge, PA on 07/13/23.  Last labs 05/14/23 at [facility] per [facility] but cannot see lab values.  Called PCP office, Dr Teofilo Fellers to request most recent labwork has to leave message on Anna's voicemail.  Will await labwork to be faxed over.

## 2023-06-20 NOTE — Telephone Encounter (Signed)
 Pt's medication was sent to pt's pharmacy as requested. Confirmation received.

## 2023-07-11 NOTE — Progress Notes (Signed)
  Electrophysiology Office Note:   Date:  07/13/2023  ID:  Keith Giles, DOB Aug 11, 1933, MRN [id number]  Primary Cardiologist: Manya Sells, MD Electrophysiologist: Manya Sells, MD      History of Present Illness:   Keith Giles is a 89 y.o. male with h/o paroxysmal AF, HTN, and HLD seen today for routine electrophysiology followup.   Since last being seen in our clinic the patient reports doing OK from a cardiac perspective. Doesn't feel like he has had any AF since his initial episode. Otherwise, he denies exertional chest pain, palpitations, dyspnea, PND, orthopnea, nausea, vomiting, dizziness, syncope, edema, weight gain, or early satiety.  Had a tightness in his chest recently, but was after lifting and turning with a heavy object and has not recurred.   Review of systems complete and found to be negative unless listed in HPI.   EP Information / Studies Reviewed:    EKG is ordered today. Personal review as below.  EKG Interpretation Date/Time:  Friday July 13 2023 10:01:20 EDT Ventricular Rate:  61 PR Interval:  208 QRS Duration:  158 QT Interval:  420 QTC Calculation: 422 R Axis:   14  Text Interpretation: Normal sinus rhythm Right bundle branch block When compared with ECG of 12-Jul-2019 15:02, Premature atrial complexes are no longer Present Confirmed by Pilar Bridge [phone]) on 07/13/2023 10:15:42 AM    Arrhythmia/Device History No specialty comments available.   Physical Exam:   VS:  BP (!) 108/56   Pulse 61   Ht 5' 11.5 (1.816 m)   Wt 159 lb 3.2 oz (72.2 kg)   SpO2 96%   BMI 21.89 kg/m    Wt Readings from Last 3 Encounters:  07/13/23 159 lb 3.2 oz (72.2 kg)  03/09/22 156 lb 6.4 oz (70.9 kg)  12/28/20 165 lb 8 oz (75.1 kg)     GEN: No acute distress NECK: No JVD; No carotid bruits CARDIAC: Regular rate and rhythm, no murmurs, rubs, gallops RESPIRATORY:  Clear to auscultation without rales, wheezing or rhonchi  ABDOMEN: Soft, non-tender,  non-distended EXTREMITIES:  No edema; No deformity   ASSESSMENT AND PLAN:    Paroxysmal AF EKG today shows NSR Continue eliquis  5 mg BID for CHA2DS2VASc of at least 3 Labs OK  HTN Stable on current regimen   HLD Continue statin     Follow up with EP Team in 12 months if needed.  Without acute EP needs, pt can follow up solely with PCP if they are willing to take over his Eliquis  and Metoprolol . Otherwise, we are happy to continue annual visits.   Signed, Tylene Galla, PA-C

## 2023-07-13 ENCOUNTER — Encounter: Payer: Self-pay | Admitting: Student

## 2023-07-13 ENCOUNTER — Ambulatory Visit: Attending: Student | Admitting: Student

## 2023-07-13 VITALS — BP 108/56 | HR 61 | Ht 71.5 in | Wt 159.2 lb

## 2023-07-13 DIAGNOSIS — E785 Hyperlipidemia, unspecified: Secondary | ICD-10-CM | POA: Diagnosis not present

## 2023-07-13 DIAGNOSIS — I480 Paroxysmal atrial fibrillation: Secondary | ICD-10-CM | POA: Insufficient documentation

## 2023-07-13 DIAGNOSIS — I10 Essential (primary) hypertension: Secondary | ICD-10-CM | POA: Diagnosis not present

## 2023-07-13 MED ORDER — APIXABAN 5 MG PO TABS: 5.0000 mg | ORAL_TABLET | Freq: Two times a day (BID) | ORAL | 3 refills | Status: AC

## 2023-07-13 MED ORDER — METOPROLOL SUCCINATE ER 25 MG PO TB24: 25.0000 mg | ORAL_TABLET | Freq: Every day | ORAL | 3 refills | Status: AC

## 2023-07-13 NOTE — Patient Instructions (Signed)
 Medication Instructions:  No medication changes today. *If you need a refill on your cardiac medications before your next appointment, please call your pharmacy*  Lab Work: No labwork ordered today. If you have labs (blood work) drawn today and your tests are completely normal, you will receive your results only by: MyChart Message (if you have MyChart) OR A paper copy in the mail If you have any lab test that is abnormal or we need to change your treatment, we will call you to review the results.  Testing/Procedures: No testing ordered today  Follow-Up: At [facility], you and your health needs are our priority.  As part of our continuing mission to provide you with exceptional heart care, our providers are all part of one team.  This team includes your primary Cardiologist (physician) and Advanced Practice Providers or APPs (Physician Assistants and Nurse Practitioners) who all work together to provide you with the care you need, when you need it.  Your next appointment:   12 month(s)  Provider:   You may see Manya Sells, MD or one of the following Advanced Practice Providers on your designated Care Team:   Mertha Abrahams, Kennard Pea "Jonelle Neri" [place], PA-C Suzann Riddle, NP Creighton Doffing, NP    We recommend signing up for the patient portal called "MyChart".  Sign up information is provided on this After Visit Summary.  MyChart is used to connect with patients for Virtual Visits (Telemedicine).  Patients are able to view lab/test results, encounter notes, upcoming appointments, etc.  Non-urgent messages can be sent to your provider as well.   To learn more about what you can do with MyChart, go to [email or web address].

## 2023-07-17 DIAGNOSIS — H353221 Exudative age-related macular degeneration, left eye, with active choroidal neovascularization: Secondary | ICD-10-CM | POA: Diagnosis not present

## 2023-10-10 DIAGNOSIS — I10 Essential (primary) hypertension: Secondary | ICD-10-CM | POA: Diagnosis not present

## 2023-10-10 DIAGNOSIS — J441 Chronic obstructive pulmonary disease with (acute) exacerbation: Secondary | ICD-10-CM | POA: Diagnosis not present

## 2023-10-10 DIAGNOSIS — K529 Noninfective gastroenteritis and colitis, unspecified: Secondary | ICD-10-CM | POA: Diagnosis not present

## 2023-10-10 DIAGNOSIS — M79652 Pain in left thigh: Secondary | ICD-10-CM | POA: Diagnosis not present

## 2023-10-10 DIAGNOSIS — M545 Low back pain, unspecified: Secondary | ICD-10-CM | POA: Diagnosis not present

## 2023-10-10 DIAGNOSIS — I6529 Occlusion and stenosis of unspecified carotid artery: Secondary | ICD-10-CM | POA: Diagnosis not present

## 2023-10-10 DIAGNOSIS — I480 Paroxysmal atrial fibrillation: Secondary | ICD-10-CM | POA: Diagnosis not present

## 2023-10-10 DIAGNOSIS — J449 Chronic obstructive pulmonary disease, unspecified: Secondary | ICD-10-CM | POA: Diagnosis not present

## 2023-10-10 DIAGNOSIS — F17200 Nicotine dependence, unspecified, uncomplicated: Secondary | ICD-10-CM | POA: Diagnosis not present

## 2023-10-10 DIAGNOSIS — Z8551 Personal history of malignant neoplasm of bladder: Secondary | ICD-10-CM | POA: Diagnosis not present

## 2023-10-10 DIAGNOSIS — K8689 Other specified diseases of pancreas: Secondary | ICD-10-CM | POA: Diagnosis not present

## 2023-10-26 ENCOUNTER — Ambulatory Visit (INDEPENDENT_AMBULATORY_CARE_PROVIDER_SITE_OTHER)

## 2023-10-26 ENCOUNTER — Ambulatory Visit

## 2023-10-26 DIAGNOSIS — G8929 Other chronic pain: Secondary | ICD-10-CM

## 2023-10-26 DIAGNOSIS — L84 Corns and callosities: Secondary | ICD-10-CM | POA: Diagnosis not present

## 2023-10-26 DIAGNOSIS — D239 Other benign neoplasm of skin, unspecified: Secondary | ICD-10-CM | POA: Diagnosis not present

## 2023-10-26 DIAGNOSIS — M79674 Pain in right toe(s): Secondary | ICD-10-CM

## 2023-10-26 NOTE — Progress Notes (Signed)
 Subjective:  Patient ID: Keith Giles, male    DOB: [birth year],  MRN: [id number]  Chief Complaint  Patient presents with   Toe Pain    Pain x forever.  It's getting worse. No trauma.     89 y.o. male presents with the above complaint.  He states that he has had pain to his left fifth digit for extended period of time.  He does have a silicone toe cover that he has refused multiple times.  He does feel that this helps.  He denies any trauma to the area.  He does relate to a small lesion in between his 4th and 5th toes.  Review of Systems: Negative except as noted in the HPI. Denies N/V/F/Ch.  Past Medical History:  Diagnosis Date   Arthritis    Bladder stone    Bladder tumor    BPH (benign prostatic hyperplasia)    Chronic asthmatic bronchitis (HCC)    COPD (chronic obstructive pulmonary disease) (HCC)    Diverticulosis of colon    Full dentures    History of adenomatous polyp of colon    2002 HYPERPLASTIC   History of TIA (transient ischemic attack)    PER PT STATES HAD TIA SEVERAL YRS NO RESIDUAL AND DID NOT SEEK MEDICAL ATTENTION   Hyperlipidemia    Hypertension    IBS (irritable bowel syndrome)    Microhematuria    Milk intolerance    Right inguinal hernia    Shortness of breath    Urgency of urination    Wears glasses    Wears hearing aid    BILATERAL    Current Outpatient Medications:    albuterol  (VENTOLIN  HFA) 108 (90 Base) MCG/ACT inhaler, Take 1 puff by mouth as needed. PRN AS NEEDED FOR WHEEZING PER PT, Disp: , Rfl:    amLODipine (NORVASC) 5 MG tablet, Take 5 mg by mouth daily., Disp: , Rfl:    ANORO ELLIPTA 62.5-25 MCG/INH AEPB, Inhale 1 puff into the lungs daily., Disp: , Rfl:    apixaban  (ELIQUIS ) 5 MG TABS tablet, Take 1 tablet (5 mg total) by mouth 2 (two) times daily., Disp: 180 tablet, Rfl: 3   atorvastatin (LIPITOR) 20 MG tablet, Take 20 mg by mouth every evening. , Disp: , Rfl:    Cholecalciferol 25 MCG (1000 UT) capsule, Take 1 tablet by mouth  daily., Disp: , Rfl:    clobetasol  (TEMOVATE ) 0.05 % external solution, Apply topically a few drops qd/bid as needed for itch at scalp, Disp: 50 mL, Rfl: 1   CREON 36000-114000 units CPEP capsule, Take by mouth. 1 capsule in the morning, 2 capsules at noon, 2 capsules in the evening, Disp: , Rfl:    cyanocobalamin  (VITAMIN B12) 500 MCG tablet, Take 1 tablet by mouth daily., Disp: , Rfl:    finasteride (PROSCAR) 5 MG tablet, Take 1 tablet by mouth daily., Disp: , Rfl: 2   ketoconazole  (NIZORAL ) 2 % shampoo, Apply to scalp 2-3 times a week and let sit several minutes before rinsing., Disp: 360 mL, Rfl: 1   latanoprost (XALATAN) 0.005 % ophthalmic solution, Place 1 drop into both eyes daily., Disp: , Rfl: 0   LORATADINE PO, Take by mouth as needed., Disp: , Rfl:    metoprolol  succinate (TOPROL -XL) 25 MG 24 hr tablet, Take 1 tablet (25 mg total) by mouth daily., Disp: 90 tablet, Rfl: 3   Multiple Vitamins-Minerals (PRESERVISION AREDS 2 PO), Take 1 capsule by mouth 2 (two) times daily., Disp: , Rfl:  Simethicone (GAS-X PO), Take by mouth as needed., Disp: , Rfl:    tacrolimus  (PROTOPIC ) 0.1 % ointment, Apply to rash on arms and legs once a day until improved. (Apply after clobetasol /CeraVe mix), Disp: 300 g, Rfl: 1   tadalafil (CIALIS) 20 MG tablet, , Disp: , Rfl:    tamsulosin (FLOMAX) 0.4 MG CAPS capsule, Take 0.4 mg by mouth daily., Disp: , Rfl:    TRELEGY ELLIPTA 100-62.5-25 MCG/ACT AEPB, 1 inhalation By Mouth Daily, Disp: , Rfl:   Social History   Tobacco Use  Smoking Status Every Day   Current packs/day: 0.25   Average packs/day: 0.3 packs/day for 60.0 years (15.0 ttl pk-yrs)   Types: Cigarettes  Smokeless Tobacco Never  Tobacco Comments   1 PPWK TO 2 WKS    Allergies  Allergen Reactions   Antihistamines, Diphenhydramine-Type Other (See Comments)    Difficulty  voiding   Sulfa Antibiotics Rash   Objective:  There were no vitals filed for this visit. There is no height or weight  on file to calculate BMI. Constitutional Well developed. Well nourished. Oriented to person, place, and time.  Vascular Dorsalis pedis pulses palpable bilaterally. Posterior tibial pulses palpable bilaterally. Capillary refill normal to all digits.  No cyanosis or clubbing noted. Pedal hair growth normal.  Neurologic Normal speech. Epicritic sensation to light touch grossly present bilaterally. Negative tinel sign at tarsal tunnel bilaterally.   Dermatologic Skin texture and turgor are within normal limits.  No open wounds. Small hyperkeratotic lesion on the medial aspect of the left fifth digit with central core.  No pinpoint bleeding.  Significant pain to direct palpation.  Musculoskeletal: Out of 5 muscle strength of all major pedal muscle groups.  Rectus foot shape.  Toes 4 and 5 do impinge on each other.   Radiographs: Taken and reviewed.  These demonstrate hammertoe deformities to digits 2 through 5.  Rectus foot shape with preserved hindfoot joint spaces. Arthritis to left 5th digit IPJ.  No signs of acute osseous pathology such as fracture, dislocation.  Assessment:   1. Toe pain, chronic, right   2. Heloma molle   3. Benign epithelial neoplasm of skin    Plan:  - Patient was evaluated and treated and all questions answered.  Heloma Molle/benign epithelial neoplasm of skin - Discussed the diagnosis of heloma molle with the patient.  The area is very tender to palpation.  He does relate that the toe sleeve that he is using has helped significantly. - Today, hyperkeratotic skin over the heloma molle was debrided to patient satisfaction.  He states that while it did not remove all of the pain it did help.  I then applied a small amount of antibiotic ointment before reapplying a new silicone toe sleeve. - He has been putting a cream into the toe sleeve before putting it on.  I discussed that it may be contributing to the formation of the heloma molle and he should limit the cream  within the toe sleeve. - He was supplied 2 more toe sleeves to fit his left fifth digit which he will use regularly.  He also acquired new shoe gear but has more space for his toes.  I recommend he continue with the shoes.  Return to clinic as needed  Prentice Ovens, [facility] Fellowship Trained Podiatric Surgeon [facility]

## 2023-11-05 DIAGNOSIS — H353231 Exudative age-related macular degeneration, bilateral, with active choroidal neovascularization: Secondary | ICD-10-CM | POA: Diagnosis not present

## 2023-11-05 DIAGNOSIS — H401132 Primary open-angle glaucoma, bilateral, moderate stage: Secondary | ICD-10-CM | POA: Diagnosis not present

## 2023-11-05 DIAGNOSIS — H35033 Hypertensive retinopathy, bilateral: Secondary | ICD-10-CM | POA: Diagnosis not present

## 2023-11-05 DIAGNOSIS — D3131 Benign neoplasm of right choroid: Secondary | ICD-10-CM | POA: Diagnosis not present

## 2023-11-05 DIAGNOSIS — H35373 Puckering of macula, bilateral: Secondary | ICD-10-CM | POA: Diagnosis not present

## 2023-11-05 DIAGNOSIS — H43813 Vitreous degeneration, bilateral: Secondary | ICD-10-CM | POA: Diagnosis not present

## 2023-11-05 DIAGNOSIS — H34832 Tributary (branch) retinal vein occlusion, left eye, with macular edema: Secondary | ICD-10-CM | POA: Diagnosis not present

## 2023-11-07 DIAGNOSIS — I6529 Occlusion and stenosis of unspecified carotid artery: Secondary | ICD-10-CM | POA: Diagnosis not present

## 2023-11-07 DIAGNOSIS — I6523 Occlusion and stenosis of bilateral carotid arteries: Secondary | ICD-10-CM | POA: Diagnosis not present

## 2023-11-08 DIAGNOSIS — M545 Low back pain, unspecified: Secondary | ICD-10-CM | POA: Diagnosis not present

## 2023-11-09 DIAGNOSIS — M545 Low back pain, unspecified: Secondary | ICD-10-CM | POA: Diagnosis not present

## 2023-11-14 DIAGNOSIS — H401133 Primary open-angle glaucoma, bilateral, severe stage: Secondary | ICD-10-CM | POA: Diagnosis not present

## 2023-11-16 ENCOUNTER — Other Ambulatory Visit: Payer: Self-pay

## 2023-11-16 DIAGNOSIS — Z8551 Personal history of malignant neoplasm of bladder: Secondary | ICD-10-CM | POA: Diagnosis not present

## 2023-11-16 DIAGNOSIS — M545 Low back pain, unspecified: Secondary | ICD-10-CM | POA: Diagnosis not present

## 2023-11-16 DIAGNOSIS — I6529 Occlusion and stenosis of unspecified carotid artery: Secondary | ICD-10-CM | POA: Diagnosis not present

## 2023-11-16 DIAGNOSIS — F17200 Nicotine dependence, unspecified, uncomplicated: Secondary | ICD-10-CM | POA: Diagnosis not present

## 2023-11-16 DIAGNOSIS — I10 Essential (primary) hypertension: Secondary | ICD-10-CM | POA: Diagnosis not present

## 2023-11-16 DIAGNOSIS — K8689 Other specified diseases of pancreas: Secondary | ICD-10-CM | POA: Diagnosis not present

## 2023-11-16 DIAGNOSIS — J449 Chronic obstructive pulmonary disease, unspecified: Secondary | ICD-10-CM | POA: Diagnosis not present

## 2023-11-16 DIAGNOSIS — I499 Cardiac arrhythmia, unspecified: Secondary | ICD-10-CM | POA: Diagnosis not present

## 2023-11-16 DIAGNOSIS — I480 Paroxysmal atrial fibrillation: Secondary | ICD-10-CM | POA: Diagnosis not present

## 2023-11-16 DIAGNOSIS — K529 Noninfective gastroenteritis and colitis, unspecified: Secondary | ICD-10-CM | POA: Diagnosis not present

## 2023-11-16 DIAGNOSIS — I779 Disorder of arteries and arterioles, unspecified: Secondary | ICD-10-CM

## 2023-11-21 DIAGNOSIS — M545 Low back pain, unspecified: Secondary | ICD-10-CM | POA: Diagnosis not present

## 2023-11-28 DIAGNOSIS — M545 Low back pain, unspecified: Secondary | ICD-10-CM | POA: Diagnosis not present

## 2023-12-03 DIAGNOSIS — H353211 Exudative age-related macular degeneration, right eye, with active choroidal neovascularization: Secondary | ICD-10-CM | POA: Diagnosis not present

## 2023-12-12 ENCOUNTER — Ambulatory Visit (INDEPENDENT_AMBULATORY_CARE_PROVIDER_SITE_OTHER): Admitting: Vascular Surgery

## 2023-12-12 ENCOUNTER — Encounter: Payer: Self-pay | Admitting: Vascular Surgery

## 2023-12-12 ENCOUNTER — Ambulatory Visit (HOSPITAL_COMMUNITY)
Admission: RE | Admit: 2023-12-12 | Discharge: 2023-12-12 | Disposition: A | Discharge status: Home / Self Care | Source: Ambulatory Visit | Attending: Vascular Surgery | Admitting: Vascular Surgery

## 2023-12-12 VITALS — BP 118/76 | HR 82 | Temp 98.3°F | Resp 20 | Ht 71.5 in | Wt 159.6 lb

## 2023-12-12 DIAGNOSIS — I6523 Occlusion and stenosis of bilateral carotid arteries: Secondary | ICD-10-CM | POA: Insufficient documentation

## 2023-12-12 DIAGNOSIS — I779 Disorder of arteries and arterioles, unspecified: Secondary | ICD-10-CM | POA: Diagnosis not present

## 2023-12-12 DIAGNOSIS — M545 Low back pain, unspecified: Secondary | ICD-10-CM | POA: Diagnosis not present

## 2023-12-12 NOTE — Progress Notes (Signed)
 [facility] [place]  ASSESSMENT / PLAN: 89 y.o. male with bilateral carotid artery stenosis (R 40-59%, L 1-39%) which is asymptomatic.  Recommend:  Abstinence from all tobacco products. Blood glucose control with goal A1c < 7%. Blood pressure control with goal blood pressure < 130/80 mmHg. Lipid reduction therapy with goal LDL-C < 55 mg/dL. Aspirin 81mg  by mouth daily. Atorvastatin 40-80mg  PO QD (or other high intensity statin therapy).  Follow up in 1 year with repeat carotid duplex.  CHIEF COMPLAINT: Carotid artery stenosis identified during screening test  HISTORY OF PRESENT ILLNESS: Keith Giles is a 89 y.o. male referred to clinic for evaluation of carotid artery stenosis.  The patient underwent screening exam for cardiovascular disease and was found to have carotid artery stenosis.  He was referred for further evaluation after this by his primary care physician.  He is a fairly healthy 89 year old with a very remote history of brief TIA.  He has had no stroke or TIA in the last 6 months.  We reviewed his duplex in detail and explained the rationale for surveillance.  I counseled him about the 2 scenarios in which surgery is indicated and explained to him that he has neither of these.  Past Medical History:  Diagnosis Date   Arthritis    Bladder stone    Bladder tumor    BPH (benign prostatic hyperplasia)    Chronic asthmatic bronchitis (HCC)    COPD (chronic obstructive pulmonary disease) (HCC)    Diverticulosis of colon    Full dentures    History of adenomatous polyp of colon    2002 HYPERPLASTIC   History of TIA (transient ischemic attack)    PER PT STATES HAD TIA SEVERAL YRS NO RESIDUAL AND DID NOT SEEK MEDICAL ATTENTION   Hyperlipidemia    Hypertension    IBS (irritable bowel syndrome)    Microhematuria    Milk intolerance    Right inguinal hernia    Shortness of breath    Urgency of urination    Wears glasses    Wears hearing aid     BILATERAL    Past Surgical History:  Procedure Laterality Date   CARDIAC CATHETERIZATION  2000   luminal irregularities LAD, CFX, and RCA  no high grade obstruction   CARDIOVASCULAR STRESS TEST  02-06-2002   fixed mild thinning of the inferior wall w/ no reversible defects identified, no ischemia/  normal LV function and wall motion, ef 69%   COLONOSCOPY N/A 02/11/2013   Procedure: COLONOSCOPY;  Surgeon: Gladis MARLA Louder, MD;  Location: [facility];  Service: Endoscopy;  Laterality: N/A;   CYSTOSCOPY W/ RETROGRADES Left 06/09/2015   Procedure: CYSTOSCOPY WITH RETROGRADE PYELOGRAM;  Surgeon: Alm Fragmin, MD;  Location: [facility];  Service: Urology;  Laterality: Left;   CYSTOSCOPY WITH URETHRAL DILATATION N/A 06/09/2015   Procedure: CYSTOSCOPY WITH URETHRAL DILATATION;  Surgeon: Alm Fragmin, MD;  Location: [facility];  Service: Urology;  Laterality: N/A;   HYDROCELE EXCISION Left 01-03-2006   INGUINAL HERNIA REPAIR Left 1961   TONSILLECTOMY     TRANSURETHRAL RESECTION OF BLADDER TUMOR N/A 06/09/2015   Procedure:  TRANSURETHRAL RESECTION OF BLADDER TUMOR (TURBT);  Surgeon: Alm Fragmin, MD;  Location: [facility];  Service: Urology;  Laterality: N/A;   URETEROSCOPY Left 06/09/2015   Procedure: URETEROSCOPY/ STONE EXTRACTION BLADDER;  Surgeon: Alm Fragmin, MD;  Location: [facility];  Service: Urology;  Laterality: Left;    History reviewed.  No pertinent family history.  Social History   Socioeconomic History   Marital status: Married    Spouse name: Not on file   Number of children: Not on file   Years of education: Not on file   Highest education level: Not on file  Occupational History   Not on file  Tobacco Use   Smoking status: Every Day    Current packs/day: 0.25    Average packs/day: 0.3 packs/day for 60.0 years (15.0 ttl pk-yrs)    Types: Cigarettes   Smokeless tobacco: Never   Tobacco comments:    1 PPWK TO  2 WKS  Vaping Use   Vaping status: Never Used  Substance and Sexual Activity   Alcohol use: Yes    Comment: occasional   Drug use: No   Sexual activity: Not on file  Other Topics Concern   Not on file  Social History Narrative   Not on file   Social Drivers of Health   Financial Resource Strain: Not on file  Food Insecurity: Not on file  Transportation Needs: Not on file  Physical Activity: Not on file  Stress: Not on file  Social Connections: Not on file  Intimate Partner Violence: Not on file    Allergies  Allergen Reactions   Antihistamines, Diphenhydramine-Type Other (See Comments)    Difficulty  voiding   Sulfa Antibiotics Rash    Current Outpatient Medications  Medication Sig Dispense Refill   albuterol  (VENTOLIN  HFA) 108 (90 Base) MCG/ACT inhaler Take 1 puff by mouth as needed. PRN AS NEEDED FOR WHEEZING PER PT     amLODipine (NORVASC) 5 MG tablet Take 5 mg by mouth daily.     ANORO ELLIPTA 62.5-25 MCG/INH AEPB Inhale 1 puff into the lungs daily.     apixaban  (ELIQUIS ) 5 MG TABS tablet Take 1 tablet (5 mg total) by mouth 2 (two) times daily. 180 tablet 3   atorvastatin (LIPITOR) 20 MG tablet Take 20 mg by mouth every evening.      Cholecalciferol 25 MCG (1000 UT) capsule Take 1 tablet by mouth daily.     clobetasol  (TEMOVATE ) 0.05 % external solution Apply topically a few drops qd/bid as needed for itch at scalp 50 mL 1   CREON 36000-114000 units CPEP capsule Take by mouth. 1 capsule in the morning, 2 capsules at noon, 2 capsules in the evening     cyanocobalamin  (VITAMIN B12) 500 MCG tablet Take 1 tablet by mouth daily.     finasteride (PROSCAR) 5 MG tablet Take 1 tablet by mouth daily.  2   ketoconazole  (NIZORAL ) 2 % shampoo Apply to scalp 2-3 times a week and let sit several minutes before rinsing. 360 mL 1   latanoprost (XALATAN) 0.005 % ophthalmic solution Place 1 drop into both eyes daily.  0   LORATADINE PO Take by mouth as needed.     metoprolol  succinate  (TOPROL -XL) 25 MG 24 hr tablet Take 1 tablet (25 mg total) by mouth daily. 90 tablet 3   Multiple Vitamins-Minerals (PRESERVISION AREDS 2 PO) Take 1 capsule by mouth 2 (two) times daily.     Multiple Vitamins-Minerals (PRESERVISION AREDS) TABS as directed Orally     Simethicone (GAS-X PO) Take by mouth as needed.     tacrolimus  (PROTOPIC ) 0.1 % ointment Apply to rash on arms and legs once a day until improved. (Apply after clobetasol /CeraVe mix) 300 g 1   tadalafil (CIALIS) 20 MG tablet      tamsulosin (FLOMAX) 0.4 MG  CAPS capsule Take 0.4 mg by mouth daily.     ZENPEP 25000-79000 units CPEP Take by mouth.     No current facility-administered medications for this visit.    PHYSICAL EXAM Vitals:   12/12/23 1235  BP: 127/82  Pulse: 82  Resp: 20  Temp: 98.3 F (36.8 C)  TempSrc: Temporal  SpO2: 96%  Weight: 159 lb 9.6 oz (72.4 kg)  Height: 5' 11.5 (1.816 m)   No acute distress Regular rate and rhythm un labored breathing Normal gait and station Moves all 4 extremities spontaneously No cranial nerve abnormalities  PERTINENT LABORATORY AND RADIOLOGIC DATA  Most recent CBC    Latest Ref Rng & Units 03/09/2022   12:20 PM 07/12/2019    3:10 PM 06/24/2017    9:26 AM  CBC  WBC 3.4 - 10.8 x10E3/uL 8.7  9.9  8.8   Hemoglobin 13.0 - 17.7 g/dL 85.3  84.4  83.9   Hematocrit 37.5 - 51.0 % 44.3  44.6  49.0   Platelets 150 - 450 x10E3/uL 239  263  257      Most recent CMP    Latest Ref Rng & Units 03/09/2022   12:20 PM 07/12/2019    3:10 PM 06/24/2017    9:26 AM  CMP  Glucose 70 - 99 mg/dL 97  880  893   BUN 8 - 27 mg/dL 15  26  18    Creatinine 0.76 - 1.27 mg/dL 8.99  8.71  8.79   Sodium 134 - 144 mmol/L 137  137  135   Potassium 3.5 - 5.2 mmol/L 4.5  4.4  4.0   Chloride 96 - 106 mmol/L 100  103  102   CO2 20 - 29 mmol/L 24  23  23    Calcium 8.6 - 10.2 mg/dL 9.3  9.5  9.0    Carotid Arterial Duplex Study   Patient Name:  Keith Giles  Date of Exam:   12/12/2023  Medical Rec  #: [id number]       Accession #:    [id number]  Date of Birth: September 01, 1933      Patient Gender: M  Patient Age:   9 years  Exam Location:  [address]  Procedure:      VAS US  CAROTID  Referring Phys: DEBBY ROBERTSON    ---------------------------------------------------------------------------  -----    Indications:      Carotid artery disease.  Risk Factors:      Hypertension, hyperlipidemia.  Comparison Study:  None.   Performing Technologist: Garnette Rockers     Examination Guidelines: A complete evaluation includes B-mode imaging,  spectral  Doppler, color Doppler, and power Doppler as needed of all accessible  portions  of each vessel. Bilateral testing is considered an integral part of a  complete  examination. Limited examinations for reoccurring indications may be  performed  as noted.     Right Carotid Findings:  +----------+--------+--------+--------+------------------+-----------------  -+           PSV cm/sEDV cm/sStenosisPlaque DescriptionComments             +----------+--------+--------+--------+------------------+-----------------  -+  CCA Prox  89      13                                intimal  thickening  +----------+--------+--------+--------+------------------+-----------------  -+  CCA Distal76      15  intimal  thickening  +----------+--------+--------+--------+------------------+-----------------  -+  ICA Prox  150     50      40-59%  calcific                               +----------+--------+--------+--------+------------------+-----------------  -+  ICA Mid   148     37      1-39%                                          +----------+--------+--------+--------+------------------+-----------------  -+  ICA Distal71      22                                                     +----------+--------+--------+--------+------------------+-----------------  -+  ECA      132      9                                                      +----------+--------+--------+--------+------------------+-----------------  -+   +----------+--------+-------+--------+-------------------+           PSV cm/sEDV cmsDescribeArm Pressure (mmHG)  +----------+--------+-------+--------+-------------------+  [id number]                                        +----------+--------+-------+--------+-------------------+   +---------+--------+--+--------+--+  VertebralPSV cm/s55EDV cm/s18  +---------+--------+--+--------+--+      Left Carotid Findings:  +----------+--------+--------+--------+------------------+-----------------  -+           PSV cm/sEDV cm/sStenosisPlaque DescriptionComments             +----------+--------+--------+--------+------------------+-----------------  -+  CCA Prox  66      14                                intimal  thickening  +----------+--------+--------+--------+------------------+-----------------  -+  CCA Mid   113     27      <50%    heterogenous                           +----------+--------+--------+--------+------------------+-----------------  -+  CCA Distal102     19      <50%    hyperechoic                            +----------+--------+--------+--------+------------------+-----------------  -+  ICA Prox  105     31      1-39%   calcific                               +----------+--------+--------+--------+------------------+-----------------  -+  ICA Mid   104     34                                                     +----------+--------+--------+--------+------------------+-----------------  -+  ICA Distal78      27                                                     +----------+--------+--------+--------+------------------+-----------------  -+  ECA      111     11                                                      +----------+--------+--------+--------+------------------+-----------------  -+   +----------+--------+--------+--------+-------------------+           PSV cm/sEDV cm/sDescribeArm Pressure (mmHG)  +----------+--------+--------+--------+-------------------+  [id number]                                         +----------+--------+--------+--------+-------------------+   +---------+--------+--+--------+--+  VertebralPSV cm/s48EDV cm/s11  +---------+--------+--+--------+--+         Summary:  Right Carotid: Velocities in the right ICA are consistent with a 40-59%                 stenosis.   Left Carotid: Velocities in the left ICA are consistent with a 1-39%  stenosis.               Non-hemodynamically significant plaque <50% noted in the  CCA.   Vertebrals: Bilateral vertebral arteries demonstrate antegrade flow.  Subclavians: Normal flow hemodynamics were seen in bilateral subclavian               arteries.    Debby SAILOR. Magda, MD [facility] Phone Number: [phone] 12/12/2023 12:38 PM   Total time spent on preparing this encounter including chart review, data review, collecting history, examining the patient, and coordinating care: 45 minutes  Portions of this report may have been transcribed using voice recognition software.  Every effort has been made to ensure accuracy; however, inadvertent computerized transcription errors may still be present.

## 2023-12-21 DIAGNOSIS — M545 Low back pain, unspecified: Secondary | ICD-10-CM | POA: Diagnosis not present

## 2023-12-24 ENCOUNTER — Ambulatory Visit: Payer: Medicare Other | Admitting: Dermatology

## 2023-12-24 DIAGNOSIS — R17 Unspecified jaundice: Secondary | ICD-10-CM | POA: Diagnosis not present

## 2023-12-24 DIAGNOSIS — R350 Frequency of micturition: Secondary | ICD-10-CM | POA: Diagnosis not present

## 2023-12-24 DIAGNOSIS — M79604 Pain in right leg: Secondary | ICD-10-CM | POA: Diagnosis not present

## 2024-01-07 DIAGNOSIS — H353211 Exudative age-related macular degeneration, right eye, with active choroidal neovascularization: Secondary | ICD-10-CM | POA: Diagnosis not present

## 2024-01-29 ENCOUNTER — Ambulatory Visit: Admitting: Dermatology

## 2024-01-29 DIAGNOSIS — D2262 Melanocytic nevi of left upper limb, including shoulder: Secondary | ICD-10-CM

## 2024-01-29 DIAGNOSIS — L814 Other melanin hyperpigmentation: Secondary | ICD-10-CM

## 2024-01-29 DIAGNOSIS — D224 Melanocytic nevi of scalp and neck: Secondary | ICD-10-CM | POA: Diagnosis not present

## 2024-01-29 DIAGNOSIS — D225 Melanocytic nevi of trunk: Secondary | ICD-10-CM

## 2024-01-29 DIAGNOSIS — L821 Other seborrheic keratosis: Secondary | ICD-10-CM

## 2024-01-29 DIAGNOSIS — D229 Melanocytic nevi, unspecified: Secondary | ICD-10-CM

## 2024-01-29 DIAGNOSIS — W908XXA Exposure to other nonionizing radiation, initial encounter: Secondary | ICD-10-CM

## 2024-01-29 DIAGNOSIS — Z1283 Encounter for screening for malignant neoplasm of skin: Secondary | ICD-10-CM

## 2024-01-29 DIAGNOSIS — L219 Seborrheic dermatitis, unspecified: Secondary | ICD-10-CM

## 2024-01-29 DIAGNOSIS — D2239 Melanocytic nevi of other parts of face: Secondary | ICD-10-CM

## 2024-01-29 DIAGNOSIS — L578 Other skin changes due to chronic exposure to nonionizing radiation: Secondary | ICD-10-CM | POA: Diagnosis not present

## 2024-01-29 DIAGNOSIS — D1801 Hemangioma of skin and subcutaneous tissue: Secondary | ICD-10-CM

## 2024-01-29 MED ORDER — KETOCONAZOLE 2 % EX SHAM: MEDICATED_SHAMPOO | CUTANEOUS | 1 refills | Status: AC

## 2024-01-29 NOTE — Progress Notes (Unsigned)
 "  Follow-Up Visit   Subjective  Keith Giles is a 89 y.o. male who presents for the following: Skin Cancer Screening and Upper Body Skin Exam  The patient presents for Upper Body Skin Exam (UBSE) for skin cancer screening and mole check. The patient has spots, moles and lesions to be evaluated, some may be new or changing.  Check spot on the right thigh, has been checked in the past but area has grown.   The following portions of the chart were reviewed this encounter and updated as appropriate: medications, allergies, medical history  Review of Systems:  No other skin or systemic complaints except as noted in HPI or Assessment and Plan.  Objective  Well appearing patient in no apparent distress; mood and affect are within normal limits.  All skin waist up examined. Relevant physical exam findings are noted in the Assessment and Plan.    Assessment & Plan   SEBORRHEIC DERMATITIS   This Visit - ketoconazole  (NIZORAL ) 2 % shampoo - Apply to scalp 2-3 times a week and let sit several minutes before rinsing. Existing Treatments - clobetasol  (TEMOVATE ) 0.05 % external solution - Apply topically a few drops qd/bid as needed for itch at scalp Skin cancer screening performed today.  Actinic Damage - Chronic condition, secondary to cumulative UV/sun exposure - diffuse scaly erythematous macules with underlying dyspigmentation - Recommend daily broad spectrum sunscreen SPF 30+ to sun-exposed areas, reapply every 2 hours as needed.  - Staying in the shade or wearing long sleeves, sun glasses (UVA+UVB protection) and wide brim hats (4-inch brim around the entire circumference of the hat) are also recommended for sun protection.  - Call for new or changing lesions.  Lentigines, Seborrheic Keratoses, Hemangiomas - Benign normal skin lesions - Benign-appearing - Call for any changes  Melanocytic Nevi - Tan-brown and/or pink-flesh-colored symmetric macules and papules - Left upper arm  2.5 mm med-dark brown macule (vs blue nevus)  - Left postauricular scalp 1cm flesh papule - Left upper back spinal 4mm pink/flesh papules x 2  - Left nasal ala - 4 mm flesh brown papule  - Left scapula 2.5 mm gray brown macule - Benign appearing on exam today - Observation - Call clinic for new or changing moles - Recommend daily use of broad spectrum spf 30+ sunscreen to sun-exposed areas.   SEBORRHEIC DERMATITIS Exam: scalp clear  Chronic condition with duration or expected duration over one year. Currently well-controlled.   Seborrheic Dermatitis is a chronic persistent rash characterized by pinkness and scaling most commonly of the mid face but also can occur on the scalp (dandruff), ears; mid chest, mid back and groin.  It tends to be exacerbated by stress and cooler weather.  People who have neurologic disease may experience new onset or exacerbation of existing seborrheic dermatitis.  The condition is not curable but treatable and can be controlled.  Treatment Plan: Continue ketoconazole  2% shampoo 2-3 times per week as directed.  Continue clobetasol  solution 1-2 times a day as needed for itch. Avoid face. Will call for refills.   Topical steroids (such as triamcinolone, fluocinolone, fluocinonide , mometasone, clobetasol , halobetasol, betamethasone , hydrocortisone) can cause thinning and lightening of the skin if they are used for too long in the same area. Your physician has selected the right strength medicine for your problem and area affected on the body. Please use your medication only as directed by your physician to prevent side effects.    Return in about 1 year (around 01/28/2025) for  EVONNE LILLETTE Andrea Ezzard, CMA, am acting as scribe for Rexene Rattler, MD .   Documentation: I have reviewed the above documentation for accuracy and completeness, and I agree with the above.  Rexene Rattler, MD    "

## 2024-01-29 NOTE — Patient Instructions (Signed)

## 2024-12-01 ENCOUNTER — Ambulatory Visit: Admitting: Dermatology
